# Patient Record
Sex: Female | Born: 1965
Health system: Southern US, Community
[De-identification: ages and names within clinical notes are randomized; demographics above are authoritative.]

## PROBLEM LIST (undated history)

## (undated) DIAGNOSIS — Z789 Other specified health status: Secondary | ICD-10-CM

## (undated) DIAGNOSIS — I517 Cardiomegaly: Secondary | ICD-10-CM

## (undated) DIAGNOSIS — I10 Essential (primary) hypertension: Secondary | ICD-10-CM

## (undated) HISTORY — DX: Cardiomegaly: I51.7

## (undated) HISTORY — DX: Essential (primary) hypertension: I10

---

## 1997-02-20 HISTORY — PX: MYOMECTOMY: SHX85

## 1997-03-23 ENCOUNTER — Encounter: Admission: RE | Admit: 1997-03-23 | Discharge: 1997-06-21 | Payer: Self-pay | Admitting: Obstetrics & Gynecology

## 1997-06-01 ENCOUNTER — Encounter: Admission: RE | Admit: 1997-06-01 | Discharge: 1997-06-01 | Payer: Self-pay | Admitting: Family Medicine

## 1997-07-02 ENCOUNTER — Encounter: Admission: RE | Admit: 1997-07-02 | Discharge: 1997-07-02 | Payer: Self-pay | Admitting: Family Medicine

## 1997-07-07 ENCOUNTER — Encounter: Admission: RE | Admit: 1997-07-07 | Discharge: 1997-07-07 | Payer: Self-pay | Admitting: Obstetrics & Gynecology

## 1997-08-05 ENCOUNTER — Encounter: Admission: RE | Admit: 1997-08-05 | Discharge: 1997-08-05 | Payer: Self-pay | Admitting: Family Medicine

## 1997-09-01 ENCOUNTER — Encounter: Admission: RE | Admit: 1997-09-01 | Discharge: 1997-09-01 | Payer: Self-pay | Admitting: Obstetrics & Gynecology

## 1997-09-10 ENCOUNTER — Ambulatory Visit (HOSPITAL_COMMUNITY): Admission: RE | Admit: 1997-09-10 | Discharge: 1997-09-10 | Payer: Self-pay | Admitting: Obstetrics & Gynecology

## 1997-09-16 ENCOUNTER — Encounter: Admission: RE | Admit: 1997-09-16 | Discharge: 1997-09-16 | Payer: Self-pay | Admitting: Family Medicine

## 1997-09-18 ENCOUNTER — Encounter: Admission: RE | Admit: 1997-09-18 | Discharge: 1997-09-18 | Payer: Self-pay | Admitting: Family Medicine

## 1997-09-21 ENCOUNTER — Encounter: Admission: RE | Admit: 1997-09-21 | Discharge: 1997-09-21 | Payer: Self-pay | Admitting: Family Medicine

## 1997-10-06 ENCOUNTER — Encounter: Admission: RE | Admit: 1997-10-06 | Discharge: 1997-10-06 | Payer: Self-pay | Admitting: Sports Medicine

## 1997-12-10 ENCOUNTER — Inpatient Hospital Stay (HOSPITAL_COMMUNITY): Admission: RE | Admit: 1997-12-10 | Discharge: 1997-12-13 | Payer: Self-pay | Admitting: Obstetrics & Gynecology

## 1997-12-15 ENCOUNTER — Encounter: Admission: RE | Admit: 1997-12-15 | Discharge: 1997-12-15 | Payer: Self-pay | Admitting: Obstetrics & Gynecology

## 1998-01-12 ENCOUNTER — Encounter: Admission: RE | Admit: 1998-01-12 | Discharge: 1998-01-12 | Payer: Self-pay | Admitting: Obstetrics & Gynecology

## 1998-01-22 ENCOUNTER — Encounter: Admission: RE | Admit: 1998-01-22 | Discharge: 1998-01-22 | Payer: Self-pay | Admitting: Internal Medicine

## 1998-06-02 ENCOUNTER — Encounter: Admission: RE | Admit: 1998-06-02 | Discharge: 1998-06-02 | Payer: Self-pay | Admitting: Family Medicine

## 1998-11-23 ENCOUNTER — Emergency Department (HOSPITAL_COMMUNITY): Admission: EM | Admit: 1998-11-23 | Discharge: 1998-11-23 | Payer: Self-pay | Admitting: Emergency Medicine

## 1998-11-24 ENCOUNTER — Encounter: Admission: RE | Admit: 1998-11-24 | Discharge: 1998-11-24 | Payer: Self-pay | Admitting: Hematology and Oncology

## 1998-11-29 ENCOUNTER — Encounter: Admission: RE | Admit: 1998-11-29 | Discharge: 1998-11-29 | Payer: Self-pay | Admitting: Internal Medicine

## 1998-12-08 ENCOUNTER — Encounter: Payer: Self-pay | Admitting: *Deleted

## 1998-12-08 ENCOUNTER — Encounter: Admission: RE | Admit: 1998-12-08 | Discharge: 1998-12-08 | Payer: Self-pay | Admitting: Hematology and Oncology

## 1998-12-08 ENCOUNTER — Ambulatory Visit (HOSPITAL_COMMUNITY): Admission: RE | Admit: 1998-12-08 | Discharge: 1998-12-08 | Payer: Self-pay | Admitting: *Deleted

## 1998-12-14 ENCOUNTER — Encounter: Admission: RE | Admit: 1998-12-14 | Discharge: 1998-12-14 | Payer: Self-pay | Admitting: Internal Medicine

## 1998-12-28 ENCOUNTER — Encounter: Payer: Self-pay | Admitting: Sports Medicine

## 1998-12-28 ENCOUNTER — Encounter: Admission: RE | Admit: 1998-12-28 | Discharge: 1998-12-28 | Payer: Self-pay | Admitting: Sports Medicine

## 1998-12-30 ENCOUNTER — Encounter: Admission: RE | Admit: 1998-12-30 | Discharge: 1998-12-30 | Payer: Self-pay | Admitting: Family Medicine

## 1999-01-03 ENCOUNTER — Inpatient Hospital Stay (HOSPITAL_COMMUNITY): Admission: RE | Admit: 1999-01-03 | Discharge: 1999-01-03 | Payer: Self-pay

## 1999-01-07 ENCOUNTER — Encounter: Admission: RE | Admit: 1999-01-07 | Discharge: 1999-01-07 | Payer: Self-pay | Admitting: Family Medicine

## 1999-01-25 ENCOUNTER — Encounter: Admission: RE | Admit: 1999-01-25 | Discharge: 1999-02-08 | Payer: Self-pay | Admitting: *Deleted

## 1999-01-27 ENCOUNTER — Encounter: Admission: RE | Admit: 1999-01-27 | Discharge: 1999-01-27 | Payer: Self-pay | Admitting: Family Medicine

## 1999-03-02 ENCOUNTER — Encounter: Admission: RE | Admit: 1999-03-02 | Discharge: 1999-03-02 | Payer: Self-pay | Admitting: Family Medicine

## 1999-04-01 ENCOUNTER — Encounter: Admission: RE | Admit: 1999-04-01 | Discharge: 1999-04-01 | Payer: Self-pay | Admitting: Family Medicine

## 1999-04-04 ENCOUNTER — Encounter: Admission: RE | Admit: 1999-04-04 | Discharge: 1999-04-04 | Payer: Self-pay | Admitting: Family Medicine

## 1999-05-05 ENCOUNTER — Encounter: Admission: RE | Admit: 1999-05-05 | Discharge: 1999-05-05 | Payer: Self-pay | Admitting: Sports Medicine

## 1999-06-07 ENCOUNTER — Encounter: Admission: RE | Admit: 1999-06-07 | Discharge: 1999-06-07 | Payer: Self-pay | Admitting: Family Medicine

## 1999-06-23 ENCOUNTER — Encounter: Admission: RE | Admit: 1999-06-23 | Discharge: 1999-06-23 | Payer: Self-pay | Admitting: Family Medicine

## 1999-06-24 ENCOUNTER — Ambulatory Visit (HOSPITAL_COMMUNITY): Admission: RE | Admit: 1999-06-24 | Discharge: 1999-06-24 | Payer: Self-pay | Admitting: Family Medicine

## 1999-07-05 ENCOUNTER — Encounter: Admission: RE | Admit: 1999-07-05 | Discharge: 1999-07-05 | Payer: Self-pay | Admitting: Family Medicine

## 1999-08-05 ENCOUNTER — Encounter: Admission: RE | Admit: 1999-08-05 | Discharge: 1999-08-05 | Payer: Self-pay | Admitting: Sports Medicine

## 1999-08-17 ENCOUNTER — Encounter: Admission: RE | Admit: 1999-08-17 | Discharge: 1999-08-17 | Payer: Self-pay | Admitting: Family Medicine

## 1999-08-21 ENCOUNTER — Inpatient Hospital Stay (HOSPITAL_COMMUNITY): Admission: AD | Admit: 1999-08-21 | Discharge: 1999-08-23 | Payer: Self-pay | Admitting: *Deleted

## 1999-10-03 ENCOUNTER — Encounter: Admission: RE | Admit: 1999-10-03 | Discharge: 1999-10-03 | Payer: Self-pay | Admitting: Family Medicine

## 1999-12-12 ENCOUNTER — Encounter: Admission: RE | Admit: 1999-12-12 | Discharge: 1999-12-12 | Payer: Self-pay | Admitting: Family Medicine

## 2000-04-09 ENCOUNTER — Encounter: Admission: RE | Admit: 2000-04-09 | Discharge: 2000-04-09 | Payer: Self-pay | Admitting: Family Medicine

## 2000-08-31 ENCOUNTER — Encounter: Payer: Self-pay | Admitting: Family Medicine

## 2000-08-31 ENCOUNTER — Ambulatory Visit (HOSPITAL_COMMUNITY): Admission: RE | Admit: 2000-08-31 | Discharge: 2000-08-31 | Payer: Self-pay | Admitting: Family Medicine

## 2000-11-01 ENCOUNTER — Other Ambulatory Visit: Admission: RE | Admit: 2000-11-01 | Discharge: 2000-11-01 | Payer: Self-pay | Admitting: Internal Medicine

## 2004-01-11 ENCOUNTER — Encounter: Admission: RE | Admit: 2004-01-11 | Discharge: 2004-01-11 | Payer: Self-pay | Admitting: Internal Medicine

## 2004-01-25 ENCOUNTER — Ambulatory Visit (HOSPITAL_COMMUNITY): Admission: RE | Admit: 2004-01-25 | Discharge: 2004-01-25 | Payer: Self-pay | Admitting: Obstetrics

## 2007-03-05 ENCOUNTER — Ambulatory Visit (HOSPITAL_COMMUNITY): Admission: RE | Admit: 2007-03-05 | Discharge: 2007-03-05 | Payer: Self-pay | Admitting: Internal Medicine

## 2008-03-18 ENCOUNTER — Ambulatory Visit (HOSPITAL_COMMUNITY): Admission: RE | Admit: 2008-03-18 | Discharge: 2008-03-18 | Payer: Self-pay | Admitting: Internal Medicine

## 2008-10-16 ENCOUNTER — Ambulatory Visit (HOSPITAL_COMMUNITY): Admission: RE | Admit: 2008-10-16 | Discharge: 2008-10-16 | Payer: Self-pay | Admitting: *Deleted

## 2009-11-08 ENCOUNTER — Emergency Department (HOSPITAL_COMMUNITY): Admission: EM | Admit: 2009-11-08 | Discharge: 2009-11-08 | Payer: Self-pay | Admitting: Family Medicine

## 2010-01-27 ENCOUNTER — Ambulatory Visit (HOSPITAL_COMMUNITY)
Admission: RE | Admit: 2010-01-27 | Discharge: 2010-01-27 | Payer: Self-pay | Source: Home / Self Care | Attending: Internal Medicine | Admitting: Internal Medicine

## 2010-02-08 ENCOUNTER — Encounter
Admission: RE | Admit: 2010-02-08 | Discharge: 2010-02-08 | Payer: Self-pay | Source: Home / Self Care | Attending: Internal Medicine | Admitting: Internal Medicine

## 2010-03-13 ENCOUNTER — Encounter: Payer: Self-pay | Admitting: Internal Medicine

## 2010-07-08 NOTE — Op Note (Signed)
[facility]  Patient:    Rebecca Zamora, Rebecca Zamora                       MRN: [id number] Proc. Date: 08/21/99 Adm. Date:  [id number] Attending:  Michaelle Copas CC:         [facility]                           Operative Report  PROCEDURE:                        Delivery Note.  INDICATIONS:                      Patient has been pushing for an hour with inadequate expulsive efforts.  Nonreassuring fetal heart rate tracing noted, with late return were noted.  Fetal vertex +3 station.  Mighty-Vac M cup explained to the patient, her husband and mother.  They complied and decide to proceed.  BRIEF DELIVERY NOTE:             Using four pulls of the Mighty-Vac, delivery with spontaneous rotation of a viable female over a second-degree episiotomy is noted.  Mild to moderate shoulder dystocia is relieved with a McRoberts, McNuber and suprapubic pressure.  The patient tolerated the procedure well. Pediatricians are in attendance.  Full-term living female with DeVilbiss suctioning on the perineum is delivered and handed to the pediatricians. Apgars 2/7.  Cord ph is collected.  Placenta is delivered manually intact; three-vessel cord is noted.  Cervix negative, no lacerations; repaired with 3-0 Monocryl x 1.  ESTIMATED BLOOD LOSS:             500 cc.  DISPOSITION:                      The patient tolerated the procedure well. Fetus to newborn nursery. DD:  08/21/99 TD:  08/22/99 Job: [id number]

## 2010-12-22 ENCOUNTER — Other Ambulatory Visit (HOSPITAL_COMMUNITY): Payer: Self-pay | Admitting: Obstetrics and Gynecology

## 2010-12-22 DIAGNOSIS — Z1231 Encounter for screening mammogram for malignant neoplasm of breast: Secondary | ICD-10-CM

## 2010-12-23 ENCOUNTER — Other Ambulatory Visit: Payer: Self-pay | Admitting: Obstetrics and Gynecology

## 2010-12-23 DIAGNOSIS — Z1231 Encounter for screening mammogram for malignant neoplasm of breast: Secondary | ICD-10-CM

## 2011-01-16 ENCOUNTER — Other Ambulatory Visit: Payer: Self-pay | Admitting: Obstetrics and Gynecology

## 2011-01-16 ENCOUNTER — Other Ambulatory Visit (HOSPITAL_COMMUNITY): Payer: 59

## 2011-01-16 MED ORDER — ACETAMINOPHEN 10 MG/ML IV SOLN: 1000.0000 mg | Freq: Four times a day (QID) | INTRAVENOUS | Status: AC

## 2011-01-18 ENCOUNTER — Encounter (HOSPITAL_COMMUNITY): Payer: Self-pay | Admitting: Pharmacist

## 2011-01-20 ENCOUNTER — Encounter (HOSPITAL_COMMUNITY): Payer: Self-pay

## 2011-01-20 ENCOUNTER — Encounter (HOSPITAL_COMMUNITY)
Admission: RE | Admit: 2011-01-20 | Discharge: 2011-01-20 | Disposition: A | Discharge status: Home / Self Care | Payer: 59 | Source: Ambulatory Visit | Attending: Obstetrics and Gynecology | Admitting: Obstetrics and Gynecology

## 2011-01-20 HISTORY — DX: Other specified health status: Z78.9

## 2011-01-20 LAB — CBC
HCT: 37 % (ref 36.0–46.0)
Hemoglobin: 12.2 g/dL (ref 12.0–15.0)
MCH: 29.5 pg (ref 26.0–34.0)
MCHC: 33 g/dL (ref 30.0–36.0)
MCV: 89.6 fL (ref 78.0–100.0)
Platelets: 235 10*3/uL (ref 150–400)
RBC: 4.13 MIL/uL (ref 3.87–5.11)
RDW: 13.8 % (ref 11.5–15.5)
WBC: 4.9 10*3/uL (ref 4.0–10.5)

## 2011-01-20 LAB — BASIC METABOLIC PANEL
CO2: 26 mEq/L (ref 19–32)
Calcium: 9.4 mg/dL (ref 8.4–10.5)
Creatinine, Ser: 1.02 mg/dL (ref 0.50–1.10)
GFR calc Af Amer: 76 mL/min — ABNORMAL LOW (ref >90)
GFR calc non Af Amer: 66 mL/min — ABNORMAL LOW (ref >90)
Potassium: 3.5 mEq/L (ref 3.5–5.1)
Sodium: 138 mEq/L (ref 135–145)

## 2011-01-20 LAB — SURGICAL PCR SCREEN
MRSA, PCR: NEGATIVE
Staphylococcus aureus: NEGATIVE

## 2011-01-20 NOTE — Patient Instructions (Addendum)
YOUR PROCEDURE IS SCHEDULED ON: Mon 01/23/11  ENTER THROUGH THE MAIN ENTRANCE OF [facility] AT:1130 am ( as instructed by office)  USE DESK PHONE AND DIAL [phone] TO INFORM us OF YOUR ARRIVAL  CALL [phone] IF YOU HAVE ANY QUESTIONS OR PROBLEMS PRIOR TO YOUR ARRIVAL.  REMEMBER: DO NOT EAT AFTER MIDNIGHT :Sunday  SPECIAL INSTRUCTIONS:clear liquids ok until 6am on Monday   YOU MAY BRUSH YOUR TEETH THE MORNING OF SURGERY   TAKE THESE MEDICINES THE DAY OF SURGERY WITH SIP OF WATER:none   DO NOT WEAR JEWELRY, EYE MAKEUP, LIPSTICK OR DARK FINGERNAIL POLISH DO NOT WEAR LOTIONS OR DEODORANT DO NOT SHAVE FOR 48 HOURS PRIOR TO SURGERY  YOU WILL NOT BE ALLOWED TO DRIVE YOURSELF HOME.  NAME OF DRIVER: Darcey Nora

## 2011-01-22 ENCOUNTER — Other Ambulatory Visit (HOSPITAL_COMMUNITY): Payer: Self-pay | Admitting: Obstetrics and Gynecology

## 2011-01-22 MED ORDER — CEFAZOLIN SODIUM-DEXTROSE 2-3 GM-% IV SOLR
2.0000 g | INTRAVENOUS | Status: AC
  Administered 2011-01-23: 2 g via INTRAVENOUS
  Filled 2011-01-22: qty 50

## 2011-01-23 ENCOUNTER — Encounter (HOSPITAL_COMMUNITY): Payer: Self-pay | Admitting: *Deleted

## 2011-01-23 ENCOUNTER — Encounter (HOSPITAL_COMMUNITY): Payer: Self-pay | Admitting: Anesthesiology

## 2011-01-23 ENCOUNTER — Ambulatory Visit (HOSPITAL_COMMUNITY)
Admission: RE | Admit: 2011-01-23 | Discharge: 2011-01-24 | Disposition: A | Discharge status: Home / Self Care | Payer: 59 | Source: Ambulatory Visit | Attending: Obstetrics and Gynecology | Admitting: Obstetrics and Gynecology

## 2011-01-23 ENCOUNTER — Other Ambulatory Visit: Payer: Self-pay | Admitting: Obstetrics and Gynecology

## 2011-01-23 ENCOUNTER — Encounter (HOSPITAL_COMMUNITY)
Admission: RE | Disposition: A | Discharge status: Home / Self Care | Payer: Self-pay | Source: Ambulatory Visit | Attending: Obstetrics and Gynecology

## 2011-01-23 ENCOUNTER — Ambulatory Visit (HOSPITAL_COMMUNITY): Payer: 59 | Admitting: Anesthesiology

## 2011-01-23 DIAGNOSIS — R102 Pelvic and perineal pain: Secondary | ICD-10-CM

## 2011-01-23 DIAGNOSIS — D250 Submucous leiomyoma of uterus: Secondary | ICD-10-CM | POA: Insufficient documentation

## 2011-01-23 DIAGNOSIS — N920 Excessive and frequent menstruation with regular cycle: Secondary | ICD-10-CM | POA: Insufficient documentation

## 2011-01-23 DIAGNOSIS — Z01818 Encounter for other preprocedural examination: Secondary | ICD-10-CM | POA: Insufficient documentation

## 2011-01-23 DIAGNOSIS — Z01812 Encounter for preprocedural laboratory examination: Secondary | ICD-10-CM | POA: Insufficient documentation

## 2011-01-23 DIAGNOSIS — D259 Leiomyoma of uterus, unspecified: Secondary | ICD-10-CM | POA: Diagnosis present

## 2011-01-23 DIAGNOSIS — D251 Intramural leiomyoma of uterus: Secondary | ICD-10-CM | POA: Insufficient documentation

## 2011-01-23 DIAGNOSIS — Z90710 Acquired absence of both cervix and uterus: Secondary | ICD-10-CM

## 2011-01-23 DIAGNOSIS — N949 Unspecified condition associated with female genital organs and menstrual cycle: Secondary | ICD-10-CM | POA: Insufficient documentation

## 2011-01-23 DIAGNOSIS — N946 Dysmenorrhea, unspecified: Secondary | ICD-10-CM | POA: Insufficient documentation

## 2011-01-23 DIAGNOSIS — D252 Subserosal leiomyoma of uterus: Secondary | ICD-10-CM | POA: Insufficient documentation

## 2011-01-23 HISTORY — DX: Pelvic and perineal pain: R10.2

## 2011-01-23 SURGERY — ROBOTIC ASSISTED TOTAL HYSTERECTOMY
Anesthesia: General | Site: Uterus | Wound class: Clean Contaminated

## 2011-01-23 MED ORDER — DEXTROSE IN LACTATED RINGERS 5 % IV SOLN
INTRAVENOUS | Status: DC
  Administered 2011-01-23 – 2011-01-24 (×2): via INTRAVENOUS

## 2011-01-23 MED ORDER — ONDANSETRON HCL 4 MG/2ML IJ SOLN
4.0000 mg | Freq: Four times a day (QID) | INTRAMUSCULAR | Status: DC | PRN
  Administered 2011-01-23 – 2011-01-24 (×2): 4 mg via INTRAVENOUS
  Filled 2011-01-23: qty 2

## 2011-01-23 MED ORDER — FENTANYL CITRATE 0.05 MG/ML IJ SOLN
INTRAMUSCULAR | Status: AC
  Administered 2011-01-23: 50 ug via INTRAVENOUS
  Filled 2011-01-23: qty 2

## 2011-01-23 MED ORDER — LIDOCAINE HCL (CARDIAC) 20 MG/ML IV SOLN
INTRAVENOUS | Status: AC
  Filled 2011-01-23: qty 5

## 2011-01-23 MED ORDER — CEFAZOLIN SODIUM 1-5 GM-% IV SOLN
1.0000 g | Freq: Three times a day (TID) | INTRAVENOUS | Status: AC
  Administered 2011-01-23 – 2011-01-24 (×2): 1 g via INTRAVENOUS
  Filled 2011-01-23 (×2): qty 50

## 2011-01-23 MED ORDER — HYDROMORPHONE HCL PF 1 MG/ML IJ SOLN
INTRAMUSCULAR | Status: AC
  Filled 2011-01-23: qty 1

## 2011-01-23 MED ORDER — GLYCOPYRROLATE 0.2 MG/ML IJ SOLN
INTRAMUSCULAR | Status: DC | PRN
  Administered 2011-01-23 (×2): 0.1 mg via INTRAVENOUS
  Administered 2011-01-23: 1 mg via INTRAVENOUS

## 2011-01-23 MED ORDER — ROCURONIUM BROMIDE 50 MG/5ML IV SOLN
INTRAVENOUS | Status: AC
  Filled 2011-01-23: qty 2

## 2011-01-23 MED ORDER — PANTOPRAZOLE SODIUM 40 MG PO TBEC
40.0000 mg | DELAYED_RELEASE_TABLET | Freq: Every day | ORAL | Status: DC
  Filled 2011-01-23 (×2): qty 1

## 2011-01-23 MED ORDER — DEXAMETHASONE SODIUM PHOSPHATE 10 MG/ML IJ SOLN
INTRAMUSCULAR | Status: AC
  Filled 2011-01-23: qty 1

## 2011-01-23 MED ORDER — FENTANYL CITRATE 0.05 MG/ML IJ SOLN
INTRAMUSCULAR | Status: AC
Start: 2011-01-23 — End: 2011-01-23
  Administered 2011-01-23: 50 ug via INTRAVENOUS
  Filled 2011-01-23: qty 2

## 2011-01-23 MED ORDER — PROPOFOL 10 MG/ML IV EMUL
INTRAVENOUS | Status: AC
  Filled 2011-01-23: qty 20

## 2011-01-23 MED ORDER — PROPOFOL 10 MG/ML IV EMUL
INTRAVENOUS | Status: DC | PRN
  Administered 2011-01-23: 150 mg via INTRAVENOUS

## 2011-01-23 MED ORDER — ONDANSETRON HCL 4 MG/2ML IJ SOLN
INTRAMUSCULAR | Status: AC
  Filled 2011-01-23: qty 2

## 2011-01-23 MED ORDER — IBUPROFEN 800 MG PO TABS: 800.0000 mg | ORAL_TABLET | Freq: Three times a day (TID) | ORAL | Status: DC | PRN

## 2011-01-23 MED ORDER — LACTATED RINGERS IR SOLN
Status: DC | PRN
  Administered 2011-01-23: 3000 mL

## 2011-01-23 MED ORDER — SODIUM CHLORIDE 0.9 % IJ SOLN: 9.0000 mL | INTRAMUSCULAR | Status: DC | PRN

## 2011-01-23 MED ORDER — SODIUM CHLORIDE 0.9 % IJ SOLN
INTRAMUSCULAR | Status: DC | PRN
  Administered 2011-01-23: 10 mL

## 2011-01-23 MED ORDER — ACETAMINOPHEN 10 MG/ML IV SOLN
INTRAVENOUS | Status: DC | PRN
  Administered 2011-01-23: 1000 mg via INTRAVENOUS

## 2011-01-23 MED ORDER — MENTHOL 3 MG MT LOZG: 1.0000 | LOZENGE | OROMUCOSAL | Status: DC | PRN

## 2011-01-23 MED ORDER — DIPHENHYDRAMINE HCL 50 MG/ML IJ SOLN: 12.5000 mg | Freq: Four times a day (QID) | INTRAMUSCULAR | Status: DC | PRN

## 2011-01-23 MED ORDER — NALOXONE HCL 0.4 MG/ML IJ SOLN: 0.4000 mg | INTRAMUSCULAR | Status: DC | PRN

## 2011-01-23 MED ORDER — ONDANSETRON HCL 4 MG PO TABS: 4.0000 mg | ORAL_TABLET | Freq: Four times a day (QID) | ORAL | Status: DC | PRN

## 2011-01-23 MED ORDER — ONDANSETRON HCL 4 MG/2ML IJ SOLN
INTRAMUSCULAR | Status: DC | PRN
  Administered 2011-01-23: 4 mg via INTRAVENOUS

## 2011-01-23 MED ORDER — LACTATED RINGERS IV SOLN
INTRAVENOUS | Status: DC
  Administered 2011-01-23: 18:00:00 via INTRAVENOUS

## 2011-01-23 MED ORDER — KETOROLAC TROMETHAMINE 30 MG/ML IJ SOLN
30.0000 mg | Freq: Four times a day (QID) | INTRAMUSCULAR | Status: DC
  Administered 2011-01-24 (×2): 30 mg via INTRAVENOUS
  Filled 2011-01-23 (×2): qty 1

## 2011-01-23 MED ORDER — FENTANYL CITRATE 0.05 MG/ML IJ SOLN
INTRAMUSCULAR | Status: AC
  Filled 2011-01-23: qty 5

## 2011-01-23 MED ORDER — ROCURONIUM BROMIDE 100 MG/10ML IV SOLN
INTRAVENOUS | Status: DC | PRN
  Administered 2011-01-23: 5 mg via INTRAVENOUS
  Administered 2011-01-23: 10 mg via INTRAVENOUS
  Administered 2011-01-23: 40 mg via INTRAVENOUS
  Administered 2011-01-23: 10 mg via INTRAVENOUS

## 2011-01-23 MED ORDER — FENTANYL CITRATE 0.05 MG/ML IJ SOLN
INTRAMUSCULAR | Status: DC | PRN
  Administered 2011-01-23 (×3): 50 ug via INTRAVENOUS
  Administered 2011-01-23: 100 ug via INTRAVENOUS

## 2011-01-23 MED ORDER — NEOSTIGMINE METHYLSULFATE 1 MG/ML IJ SOLN
INTRAMUSCULAR | Status: DC | PRN
  Administered 2011-01-23: 5 mg via INTRAVENOUS

## 2011-01-23 MED ORDER — ZOLPIDEM TARTRATE 5 MG PO TABS: 5.0000 mg | ORAL_TABLET | Freq: Every evening | ORAL | Status: DC | PRN

## 2011-01-23 MED ORDER — FENTANYL CITRATE 0.05 MG/ML IJ SOLN
25.0000 ug | INTRAMUSCULAR | Status: DC | PRN
  Administered 2011-01-23 (×4): 50 ug via INTRAVENOUS

## 2011-01-23 MED ORDER — HYDROMORPHONE HCL 2 MG PO TABS: 4.0000 mg | ORAL_TABLET | ORAL | Status: DC | PRN

## 2011-01-23 MED ORDER — PROMETHAZINE HCL 25 MG/ML IJ SOLN
12.5000 mg | Freq: Once | INTRAMUSCULAR | Status: AC
  Administered 2011-01-23: 12.5 mg via INTRAVENOUS
  Filled 2011-01-23: qty 1

## 2011-01-23 MED ORDER — DIPHENHYDRAMINE HCL 12.5 MG/5ML PO ELIX: 12.5000 mg | ORAL_SOLUTION | Freq: Four times a day (QID) | ORAL | Status: DC | PRN

## 2011-01-23 MED ORDER — GLYCOPYRROLATE 0.2 MG/ML IJ SOLN
INTRAMUSCULAR | Status: AC
  Filled 2011-01-23: qty 1

## 2011-01-23 MED ORDER — HYDROMORPHONE HCL PF 1 MG/ML IJ SOLN: 0.2000 mg | INTRAMUSCULAR | Status: DC | PRN

## 2011-01-23 MED ORDER — BUPIVACAINE HCL (PF) 0.25 % IJ SOLN
INTRAMUSCULAR | Status: DC | PRN
  Administered 2011-01-23: 15 mL

## 2011-01-23 MED ORDER — GLYCOPYRROLATE 0.2 MG/ML IJ SOLN
INTRAMUSCULAR | Status: AC
  Filled 2011-01-23: qty 3

## 2011-01-23 MED ORDER — HYDROMORPHONE 0.3 MG/ML IV SOLN
INTRAVENOUS | Status: DC
  Administered 2011-01-23: 7.5 mg via INTRAVENOUS
  Administered 2011-01-23: 2.19 mg via INTRAVENOUS
  Administered 2011-01-24: 0.2 mg via INTRAVENOUS
  Filled 2011-01-23: qty 25

## 2011-01-23 MED ORDER — ONDANSETRON HCL 4 MG/2ML IJ SOLN
4.0000 mg | Freq: Four times a day (QID) | INTRAMUSCULAR | Status: DC | PRN
  Filled 2011-01-23: qty 2

## 2011-01-23 MED ORDER — LIDOCAINE HCL (CARDIAC) 20 MG/ML IV SOLN
INTRAVENOUS | Status: DC | PRN
  Administered 2011-01-23: 80 mg via INTRAVENOUS

## 2011-01-23 MED ORDER — ARTIFICIAL TEARS OP OINT
TOPICAL_OINTMENT | OPHTHALMIC | Status: DC | PRN
  Administered 2011-01-23: 1 via OPHTHALMIC

## 2011-01-23 MED ORDER — KETOROLAC TROMETHAMINE 60 MG/2ML IM SOLN
INTRAMUSCULAR | Status: DC | PRN
  Administered 2011-01-23: 30 mg via INTRAMUSCULAR

## 2011-01-23 MED ORDER — HYDROMORPHONE HCL PF 1 MG/ML IJ SOLN
INTRAMUSCULAR | Status: DC | PRN
  Administered 2011-01-23: 0.5 mg via INTRAVENOUS

## 2011-01-23 MED ORDER — MIDAZOLAM HCL 2 MG/2ML IJ SOLN
INTRAMUSCULAR | Status: AC
  Filled 2011-01-23: qty 2

## 2011-01-23 MED ORDER — LACTATED RINGERS IV SOLN
INTRAVENOUS | Status: DC
  Administered 2011-01-23: 125 mL/h via INTRAVENOUS
  Administered 2011-01-23: 16:00:00 via INTRAVENOUS

## 2011-01-23 MED ORDER — ACETAMINOPHEN 10 MG/ML IV SOLN
1000.0000 mg | Freq: Once | INTRAVENOUS | Status: DC
  Filled 2011-01-23: qty 100

## 2011-01-23 MED ORDER — DEXAMETHASONE SODIUM PHOSPHATE 10 MG/ML IJ SOLN
INTRAMUSCULAR | Status: DC | PRN
  Administered 2011-01-23: 10 mg via INTRAVENOUS

## 2011-01-23 MED ORDER — MIDAZOLAM HCL 5 MG/5ML IJ SOLN
INTRAMUSCULAR | Status: DC | PRN
  Administered 2011-01-23 (×2): 1 mg via INTRAVENOUS

## 2011-01-23 MED ORDER — KETOROLAC TROMETHAMINE 30 MG/ML IJ SOLN: 30.0000 mg | Freq: Four times a day (QID) | INTRAMUSCULAR | Status: DC

## 2011-01-23 MED ORDER — KETOROLAC TROMETHAMINE 30 MG/ML IJ SOLN
INTRAMUSCULAR | Status: DC | PRN
  Administered 2011-01-23: 30 mg via INTRAVENOUS

## 2011-01-23 MED ORDER — NEOSTIGMINE METHYLSULFATE 1 MG/ML IJ SOLN
INTRAMUSCULAR | Status: AC
  Filled 2011-01-23: qty 10

## 2011-01-23 SURGICAL SUPPLY — 54 items
ADH SKN CLS APL DERMABOND .7 (GAUZE/BANDAGES/DRESSINGS) ×2
BAG URINE DRAINAGE (UROLOGICAL SUPPLIES) ×3 IMPLANT
BARRIER ADHS 3X4 INTERCEED (GAUZE/BANDAGES/DRESSINGS) ×2 IMPLANT
BRR ADH 4X3 ABS CNTRL BYND (GAUZE/BANDAGES/DRESSINGS)
CABLE HIGH FREQUENCY MONO STRZ (ELECTRODE) ×3 IMPLANT
CATH FOLEY 3WAY  5CC 16FR (CATHETERS) ×1
CATH FOLEY 3WAY 5CC 16FR (CATHETERS) ×2 IMPLANT
CHLORAPREP W/TINT 26ML (MISCELLANEOUS) ×3 IMPLANT
CLOTH BEACON ORANGE TIMEOUT ST (SAFETY) ×3 IMPLANT
CONT PATH 16OZ SNAP LID 3702 (MISCELLANEOUS) ×3 IMPLANT
COVER MAYO STAND STRL (DRAPES) ×3 IMPLANT
COVER TABLE BACK 60X90 (DRAPES) ×6 IMPLANT
COVER TIP SHEARS 8 DVNC (MISCELLANEOUS) ×2 IMPLANT
COVER TIP SHEARS 8MM DA VINCI (MISCELLANEOUS) ×1
DECANTER SPIKE VIAL GLASS SM (MISCELLANEOUS) ×3 IMPLANT
DERMABOND ADVANCED (GAUZE/BANDAGES/DRESSINGS) ×1
DERMABOND ADVANCED .7 DNX12 (GAUZE/BANDAGES/DRESSINGS) ×2 IMPLANT
DRAPE HUG U DISPOSABLE (DRAPE) ×3 IMPLANT
DRAPE LG THREE QUARTER DISP (DRAPES) ×6 IMPLANT
DRAPE MONITOR DA VINCI (DRAPE) ×3 IMPLANT
DRAPE WARM FLUID 44X44 (DRAPE) ×3 IMPLANT
ELECT REM PT RETURN 9FT ADLT (ELECTROSURGICAL) ×3
ELECTRODE REM PT RTRN 9FT ADLT (ELECTROSURGICAL) ×2 IMPLANT
EVACUATOR SMOKE 8.L (FILTER) ×3 IMPLANT
GAUZE VASELINE 3X9 (GAUZE/BANDAGES/DRESSINGS) IMPLANT
GLOVE BIO SURGEON STRL SZ 6.5 (GLOVE) ×10 IMPLANT
GLOVE BIOGEL PI IND STRL 7.0 (GLOVE) ×6 IMPLANT
GLOVE BIOGEL PI INDICATOR 7.0 (GLOVE) ×6
GOWN PREVENTION PLUS LG XLONG (DISPOSABLE) ×12 IMPLANT
KIT DISP ACCESSORY 4 ARM (KITS) ×3 IMPLANT
NDL INSUFFLATION 14GA 120MM (NEEDLE) ×1 IMPLANT
NEEDLE INSUFFLATION 14GA 120MM (NEEDLE) ×3 IMPLANT
NS IRRIG 1000ML POUR BTL (IV SOLUTION) ×9 IMPLANT
OCCLUDER COLPOPNEUMO (BALLOONS) ×2 IMPLANT
PACK LAVH (CUSTOM PROCEDURE TRAY) ×3 IMPLANT
PAD PREP 24X48 CUFFED NSTRL (MISCELLANEOUS) ×6 IMPLANT
PLUG CATH AND CAP STER (CATHETERS) ×3 IMPLANT
SCISSORS LAP 5X35 DISP (ENDOMECHANICALS) ×1 IMPLANT
SET IRRIG TUBING LAPAROSCOPIC (IRRIGATION / IRRIGATOR) ×3 IMPLANT
SOLUTION ELECTROLUBE (MISCELLANEOUS) ×3 IMPLANT
SUT VIC AB 0 CT1 27 (SUTURE)
SUT VIC AB 0 CT1 27XBRD ANTBC (SUTURE) ×10 IMPLANT
SUT VICRYL 0 UR6 27IN ABS (SUTURE) ×4 IMPLANT
SUT VICRYL 4-0 PS2 18IN ABS (SUTURE) ×6 IMPLANT
SYR 50ML LL SCALE MARK (SYRINGE) ×3 IMPLANT
TIP UTERINE 6.7X8CM BLUE DISP (MISCELLANEOUS) ×1 IMPLANT
TOWEL OR 17X24 6PK STRL BLUE (TOWEL DISPOSABLE) ×9 IMPLANT
TROCAR 12M 150ML BLUNT (TROCAR) IMPLANT
TROCAR DISP BLADELESS 8 DVNC (TROCAR) ×1 IMPLANT
TROCAR DISP BLADELESS 8MM (TROCAR) ×1
TROCAR XCEL NON-BLD 5MMX100MML (ENDOMECHANICALS) ×1 IMPLANT
TROCAR Z-THREAD 12X150 (TROCAR) ×3 IMPLANT
TUBING FILTER THERMOFLATOR (ELECTROSURGICAL) ×3 IMPLANT
WARMER LAPAROSCOPE (MISCELLANEOUS) ×3 IMPLANT

## 2011-01-23 NOTE — Anesthesia Preprocedure Evaluation (Signed)
Anesthesia Evaluation  Patient identified by MRN, date of birth, ID band Patient awake    Reviewed: Allergy & Precautions, H&P , NPO status , Patient's Chart, lab work & pertinent test results  Airway Mallampati: I TM Distance: >3 FB Neck ROM: full    Dental No notable dental hx.    Pulmonary neg pulmonary ROS,    Pulmonary exam normal       Cardiovascular neg cardio ROS     Neuro/Psych  Headaches, Negative Psych ROS   GI/Hepatic negative GI ROS, Neg liver ROS,   Endo/Other  Negative Endocrine ROS  Renal/GU negative Renal ROS  Genitourinary negative   Musculoskeletal negative musculoskeletal ROS (+)   Abdominal Normal abdominal exam  (+)   Peds negative pediatric ROS (+)  Hematology negative hematology ROS (+)   Anesthesia Other Findings   Reproductive/Obstetrics negative OB ROS                           Anesthesia Physical Anesthesia Plan  ASA: I  Anesthesia Plan: General   Post-op Pain Management:    Induction: Intravenous  Airway Management Planned: Oral ETT  Additional Equipment:   Intra-op Plan:   Post-operative Plan: Extubation in OR  Informed Consent: I have reviewed the patients History and Physical, chart, labs and discussed the procedure including the risks, benefits and alternatives for the proposed anesthesia with the patient or authorized representative who has indicated his/her understanding and acceptance.   Dental advisory given  Plan Discussed with: CRNA  Anesthesia Plan Comments:         Anesthesia Quick Evaluation

## 2011-01-23 NOTE — Anesthesia Postprocedure Evaluation (Signed)
Anesthesia Post Note  Patient: Rebecca Zamora  Procedure(s) Performed:  ROBOTIC ASSISTED TOTAL HYSTERECTOMY; LYSIS OF ADHESION  Anesthesia type: General  Patient location: PACU  Post pain: Pain level controlled  Post assessment: Post-op Vital signs reviewed  Last Vitals:  Filed Vitals:   01/23/11 1730  BP: 149/76  Pulse: 47  Temp:   Resp: 25    Post vital signs: Reviewed  Level of consciousness: sedated  Complications: No apparent anesthesia complicationsfj

## 2011-01-23 NOTE — Anesthesia Procedure Notes (Signed)
Procedure Name: Intubation Date/Time: 01/23/2011 1:10 PM Performed by: Karleen Dolphin Pre-anesthesia Checklist: Suction available, Timeout performed, Emergency Drugs available, Patient identified and Patient being monitored Patient Re-evaluated:Patient Re-evaluated prior to inductionOxygen Delivery Method: Circle System Utilized Preoxygenation: Pre-oxygenation with 100% oxygen Intubation Type: IV induction Ventilation: Mask ventilation without difficulty Laryngoscope Size: Mac and 3 Grade View: Grade I Tube type: Oral Tube size: 7.0 mm Number of attempts: 1 Airway Equipment and Method: stylet Placement Confirmation: ETT inserted through vocal cords under direct vision,  breath sounds checked- equal and bilateral and positive ETCO2 Secured at: 22 cm Tube secured with: Tape Dental Injury: Teeth and Oropharynx as per pre-operative assessment

## 2011-01-23 NOTE — H&P (Signed)
See scanned document.

## 2011-01-23 NOTE — Transfer of Care (Signed)
Immediate Anesthesia Transfer of Care Note  Patient: Rebecca Zamora  Procedure(s) Performed:  ROBOTIC ASSISTED TOTAL HYSTERECTOMY; LYSIS OF ADHESION  Patient Location: PACU  Anesthesia Type: General  Level of Consciousness: awake, alert  and oriented  Airway & Oxygen Therapy: Patient Spontanous Breathing and Patient connected to nasal cannula oxygen  Post-op Assessment: Report given to PACU RN and Post -op Vital signs reviewed and stable  Post vital signs: Reviewed and stable  Complications: No apparent anesthesia complications

## 2011-01-23 NOTE — Progress Notes (Signed)
01/23/2011  5:09 PM  PATIENT: Rebecca Zamora PRE-OPERATIVE DIAGNOSIS:  Fibroids, Menorrhagia, Dysmenorrhea/pelvic pain  POST-OPERATIVE DIAGNOSIS:  Fibroids, Menorrhagia, Dysmenorrhea/Pelvic pain, abdominopelvic adhesions  PROCEDURE: Da Vinci robotic total hysterectomy, lysis of adhesion  SURGEON:  Surgeon(s): Serita Kyle, MD Tresa Endo A. Fogleman  ASSISTANT: Noland Fordyce, MD                       Arlan Organ, CNM  ANESTHESIA:   general  FINDINGS:omental adhesion in the midline of ant abdominal wall, fibroid uterus, ovaries adherent to uterus, post adhesion of bowels to uterus, nl tubes w/ peritubal adhesions, nl liver edge. Ureters peristalsing bilaterally    ESTIMATED BLOOD LOSS: 50 cc   Intake/Output Summary (Last 24 hours) at 01/23/11 1709 Last data filed at 01/23/11 1643  Gross per 24 hour  Intake   1800 ml  Output    300 ml  Net   1500 ml     BLOOD ADMINISTERED:none   SPECIMEN: uterus with cervix,    DISPOSITION OF SPECIMEN:  PATHOLOGY  COUNTS:  YES  PLAN OF CARE: admit for overnight observation

## 2011-01-24 LAB — CBC
HCT: 35.8 % — ABNORMAL LOW (ref 36.0–46.0)
Hemoglobin: 12.3 g/dL (ref 12.0–15.0)
MCHC: 34.4 g/dL (ref 30.0–36.0)
WBC: 9.4 10*3/uL (ref 4.0–10.5)

## 2011-01-24 LAB — BASIC METABOLIC PANEL
BUN: 7 mg/dL (ref 6–23)
CO2: 26 mEq/L (ref 19–32)
Chloride: 101 mEq/L (ref 96–112)
Glucose, Bld: 158 mg/dL — ABNORMAL HIGH (ref 70–99)
Potassium: 3.4 mEq/L — ABNORMAL LOW (ref 3.5–5.1)

## 2011-01-24 MED ORDER — HYDROMORPHONE HCL 4 MG PO TABS: 4.0000 mg | ORAL_TABLET | ORAL | Status: AC | PRN

## 2011-01-24 MED ORDER — IBUPROFEN 800 MG PO TABS: 800.0000 mg | ORAL_TABLET | Freq: Three times a day (TID) | ORAL | Status: AC | PRN

## 2011-01-24 NOTE — Discharge Summary (Signed)
Physician Discharge Summary  Patient ID: Rebecca Zamora MRN: [id number] DOB/AGE: June 01, 1965 45 y.o.  Admit date: 01/23/2011 Discharge date: 01/24/2011  Admission Diagnoses: menorrhagia, pelvic pain, fibroid uterus  Discharge Diagnoses: menorrhagia, pelvic pain, fibroid uterus, abdominopelvic adhesions Principal Problem:  *Pelvic pain in female Active Problems:  Fibroid uterus  Menorrhagia   Discharged Condition: stable Hospital Course:pt underwent da Vinci Total hysterectomy, LOA. Uncomplicated postop course Consults:none  Significant Diagnostic Studies: labs: hgb9.4, hct 35.8 wbc 9.4  plt 238 creat 0.91  Treatments: surgery: Da Vinci Total hysterectomy, LOA  Discharge Exam: Blood pressure 119/77, pulse 51, temperature 98.7 F (37.1 C), temperature source Oral, resp. rate 18, height 5\' 2"  (1.575 m), weight 71.668 kg (158 lb), last menstrual period 01/21/2011, SpO2 100.00%. General appearance: alert, cooperative and no distress Resp: clear to auscultation bilaterally GI: soft, non-tender; bowel sounds normal; no masses,  no organomegaly Pelvic: deferred Extremities: no edema, redness or tenderness in the calves or thighs Incision/Wound:well approximated, clean/dry intact  Disposition:   Discharge Orders    Future Appointments: Provider: Department: Dept Phone: Center:   02/02/2011 3:40 PM [facility] [phone] [facility]     Future Orders Please Complete By Expires   Diet - low sodium heart healthy      Discharge instructions      Comments:   Call if temperature greater than equal to 100.4, nothing per vagina for 4-6 weeks or severe nausea vomiting, increased incisional pain , drainage or redness in the incision site, no straining with bowel movements, showers no bathtub, no straining with bowel movement, no heavy lifting x 2wk ( 25lb), no driving x 2 wk   May walk up steps      No wound care        Current Discharge Medication List    START  taking these medications   Details  HYDROmorphone (DILAUDID) 4 MG tablet Take 1 tablet (4 mg total) by mouth every 4 (four) hours as needed. Qty: 30 tablet, Refills: 0    ibuprofen (ADVIL,MOTRIN) 800 MG tablet Take 1 tablet (800 mg total) by mouth every 8 (eight) hours as needed for pain (mild pain). Qty: 30 tablet, Refills: 4      CONTINUE these medications which have NOT CHANGED   Details  Cholecalciferol (VITAMIN D) 2000 UNITS tablet Take 4,000 Units by mouth daily. Pt has stopped taking prior to procedure.     fish oil-omega-3 fatty acids 1000 MG capsule Take 2 g by mouth daily. Pt has stopped taking prior to procedure.     senna-docusate (SENOKOT-S) 8.6-50 MG per tablet Take 1 tablet by mouth daily. Uses Senokot.     traMADol (ULTRAM) 50 MG tablet Take 50 mg by mouth every 6 (six) hours as needed. Maximum dose= 8 tablets per day  Pt. Stopped taking because of nausea.        Follow-up Information    Follow up with Rebecca Court A, MD in 2 weeks.   Contact information:   [address] [place] [address] [phone]          Signed: Serita Zamora 01/24/2011, 8:35 AM

## 2011-01-24 NOTE — Progress Notes (Signed)
Subjective: Patient reports tolerating PO, + flatus and no problems voiding.    Objective: I have reviewed patient's vital signs.  vital signs, intake and output and labs. Filed Vitals:   01/24/11 0602  BP:   Pulse:   Temp:   Resp: 18   I/O last 3 completed shifts: In: 2000 [I.V.:2000] Out: 2975 [Urine:2850; Emesis/NG output:75; Blood:50]    Lab Results  Component Value Date   WBC 9.4 01/24/2011   HGB 12.3 01/24/2011   HCT 35.8* 01/24/2011   MCV 87.1 01/24/2011   PLT 238 01/24/2011   Lab Results  Component Value Date   CREATININE 0.91 01/24/2011    EXAM General: alert, cooperative and no distress Resp: clear to auscultation bilaterally Cardio: regular rate and rhythm, S1, S2 normal, no murmur, click, rub or gallop GI: soft, non-tender; bowel sounds normal; no masses,  no organomegaly and incision: clean, dry and intact Extremities: no edema, redness or tenderness in the calves or thighs Vaginal Bleeding: none  Assessment: s/p Procedure(s): ROBOTIC ASSISTED TOTAL HYSTERECTOMY LYSIS OF ADHESION: stable  Plan: Advance diet Discontinue IV fluids Discharge home  LOS: 1 day  d/c instructions reviewed. Script: motrin, dilaudid   Audree Schrecengost A, MD 01/24/2011 8:42 AM    01/24/2011, 8:42 AM

## 2011-01-24 NOTE — Anesthesia Postprocedure Evaluation (Signed)
  Anesthesia Post-op Note  Patient: Rebecca Zamora  Procedure(s) Performed:  ROBOTIC ASSISTED TOTAL HYSTERECTOMY; LYSIS OF ADHESION  Patient Location: PACU  Anesthesia Type: General  Level of Consciousness: awake  Airway and Oxygen Therapy: Patient Spontanous Breathing  Post-op Pain: none  Post-op Assessment: Post-op Vital signs reviewed  Post-op Vital Signs: Reviewed and stable  Complications: No apparent anesthesia complications

## 2011-01-24 NOTE — Op Note (Signed)
Rebecca Zamora, Rebecca Zamora                ACCOUNT NO.:  [id number]  MEDICAL RECORD NO.:  [id number]  LOCATION:  9303                          FACILITY:  [facility]  PHYSICIAN:  Maxie Better, M.D.DATE OF BIRTH:  11-03-1965  DATE OF PROCEDURE:  01/23/2011 DATE OF DISCHARGE:                              OPERATIVE REPORT   PREOPERATIVE DIAGNOSES: 1. Pelvic pain. 2. Menorrhagia. 3. Uterine fibroids.  PROCEDURES: 1. Da Vinci total hysterectomy. 2. Lysis of adhesions.  POSTOPERATIVE DIAGNOSES: 1. Pelvic pain. 2. Abdominal wall adhesions. 3. Menorrhagia. 4. Uterine fibroids.  ANESTHESIA:  General.  SURGEON:  Maxie Better, MD.  PROCEDURE IN DETAIL:  Under adequate general anesthesia, the patient was placed in the dorsal lithotomy position.  Examination under anesthesia revealed the anteverted about 10-week-size uterus.  No adnexal masses could be appreciated.  The patient was positioned for robotic surgery. She was prepped with ChloraPrep and Betadine solution.  The patient was then sterilely draped.  A weighted speculum was placed in the vagina. Sims retractor was placed anteriorly.  The cervix had a 0 Vicryl figure- of-eight suture placed on the anterior and posterior lip of the cervix.  The cervical os was then serially dilated up to #25 [facility] dilator, and the uterus sounded to 8 cm.  A #8 uterine manipulator was then obtained. Measurement of the cervix was done.  Small RUMI KOH ring was selected and RUMI manipulator placed without incident, and the weighted speculum was then removed.  A sterile indwelling Foley catheter was placed.  Attention was then turned to the abdomen where a supraumbilical incision was made and Veress needle was introduced.  Opening pressure of 4 was noted.  3 L of CO2 was insufflated.  Veress needle was then removed.  A 12-mm trocar with sleeve was introduced in the abdomen without incident. The robotic camera was then inserted through that port.  On  entering the cavity, it was noted that there was omental adhesions along the entire midline of the abdomen.  The additional port sites was placed with two 8 mm on the left, one 8 mm port site on the right and assistant port of 5. The robot was then docked on the patient's left side, and a Pro grasper, a PK, and monopolar scissors were then placed.  I then transferred to the surgical console, where the first event was the lysed adhesions of the omentum off the anterior abdominal wall.  The uterus was then lifted anteriorly and there were several dense adhesions to the posterior aspect of the uterus, which was also lysed.  The patient's history is notable for previous myomectomy.  The tubes has had some peritubal adhesions, shortened.  Ovaries were adherent to the fundus laterally on both sides.  Posterior cul-de-sac was otherwise unremarkable.  The procedure was began by taking the right ovary off the uterus, cauterizing the left round ligament.  The left utero-ovarian ligament was also serially clamped, cauterized, and cut.  The anterior leaf of the broad ligament was opened transversely and the bladder was sharply dissected off the KOH ring and displaced inferiorly.  The left uterine vessels was subsequently skeletonized.  Incidentally, both ureters had been noted prior to the onset  of the case, and they were both peristalsing.  The uterine vessels were skeletonized on the left.  They were sequentially clamped, cauterized, but not cut and then on the contralateral side, the same procedure with respect to the ovary being taken off the uterine wall was then accomplished.  The anterior leaf of the broad ligament was opened again posteriorly and anteriorly further connecting the vesical peritoneum.  The uterine vessels were skeletonized on the right.  They were clamped on several serial sequential clamps, cauterized, and ultimately cut and on the opposite side, cutting of the uterine  vessels were then accomplished with blanching of the uterus.  The KOH ring was then displaced further.  The insufflator vaginally was then insufflated and the anterior incision was then made overlying this cervical vaginal junction.  The uterus was then severed from its vaginal attachment.  The uterus with cervix was then brought through the vagina.  There were bleeding on the posterior vaginal cuff, which was cauterized with the PK grasper.  Once this was done, it was then noted that the angle on the vaginal side had the tear extending superficial and inferiorly and therefore a running stitch of 2-0 Vicryl suture was placed. The vaginal cuff was then closed with 0 Vicryl figure-of-eight sutures.  The needles would be removed at the end of the case.  Abdomen was irrigated and suctioned.  Both pedicles were noted to be good, and hemostasis.  At that point, the procedure was felt to be completed.  I moved away from the console.  The robot was undocked.  Then, using a 5-mm scope and the supraumbilical site, the 6 needles were then removed.  Once this was done, the abdomen again was irrigated, suctioned, good hemostasis noted.  The vagina was palpated manually for any defects and none was felt.  The robotic camera was again inserted in place.  After good inspection, the lower sites were all removed.  The supraumbilical site was then removed under direct visualization.  The supraumbilical incision was closed with 0 Vicryl figure-of-eight suture on that fascia and all the rest were closed with 4-0 Vicryl subcuticular stitch and Dermabond.  SPECIMEN:  Uterus with cervix weighing 249 g in the operating room.  INTRAOPERATIVE FLUID:  1800 mL.  URINE OUTPUT:  150 mL clear yellow urine.  ESTIMATED BLOOD LOSS:  50 mL.  COMPLICATIONS:  None.  The patient tolerated this procedure well, was transferred to recovery in stable condition.     Maxie Better, M.D.     Tucker/MEDQ  D:  01/23/2011   T:  01/24/2011  Job:  [id number]

## 2011-01-24 NOTE — Addendum Note (Signed)
Addendum  created 01/24/11 1000 by Isabella Bowens   Modules edited:Notes Section

## 2011-01-30 ENCOUNTER — Ambulatory Visit (HOSPITAL_COMMUNITY): Payer: Self-pay

## 2011-02-01 ENCOUNTER — Ambulatory Visit (HOSPITAL_COMMUNITY): Payer: Self-pay

## 2011-02-02 ENCOUNTER — Ambulatory Visit
Admission: RE | Admit: 2011-02-02 | Discharge: 2011-02-02 | Disposition: A | Discharge status: Home / Self Care | Payer: 59 | Source: Ambulatory Visit | Attending: Obstetrics and Gynecology | Admitting: Obstetrics and Gynecology

## 2011-02-02 DIAGNOSIS — Z1231 Encounter for screening mammogram for malignant neoplasm of breast: Secondary | ICD-10-CM

## 2012-01-05 ENCOUNTER — Other Ambulatory Visit: Payer: Self-pay | Admitting: Obstetrics and Gynecology

## 2012-01-05 DIAGNOSIS — Z1231 Encounter for screening mammogram for malignant neoplasm of breast: Secondary | ICD-10-CM

## 2012-02-06 ENCOUNTER — Ambulatory Visit
Admission: RE | Admit: 2012-02-06 | Discharge: 2012-02-06 | Disposition: A | Discharge status: Home / Self Care | Payer: 59 | Source: Ambulatory Visit | Attending: Obstetrics and Gynecology | Admitting: Obstetrics and Gynecology

## 2012-02-06 DIAGNOSIS — Z1231 Encounter for screening mammogram for malignant neoplasm of breast: Secondary | ICD-10-CM

## 2012-02-08 ENCOUNTER — Ambulatory Visit: Payer: 59

## 2012-04-06 ENCOUNTER — Other Ambulatory Visit: Payer: Self-pay

## 2012-04-17 ENCOUNTER — Other Ambulatory Visit (HOSPITAL_COMMUNITY): Payer: Self-pay | Admitting: Internal Medicine

## 2012-04-17 DIAGNOSIS — Z1231 Encounter for screening mammogram for malignant neoplasm of breast: Secondary | ICD-10-CM

## 2012-12-26 ENCOUNTER — Other Ambulatory Visit: Payer: Self-pay

## 2013-01-09 ENCOUNTER — Other Ambulatory Visit: Payer: Self-pay

## 2013-01-09 DIAGNOSIS — Z1231 Encounter for screening mammogram for malignant neoplasm of breast: Secondary | ICD-10-CM

## 2013-02-05 ENCOUNTER — Ambulatory Visit
Admission: RE | Admit: 2013-02-05 | Discharge: 2013-02-05 | Disposition: A | Discharge status: Home / Self Care | Payer: 59 | Source: Ambulatory Visit

## 2013-02-05 DIAGNOSIS — Z1231 Encounter for screening mammogram for malignant neoplasm of breast: Secondary | ICD-10-CM

## 2013-02-06 ENCOUNTER — Ambulatory Visit: Payer: 59

## 2013-02-12 ENCOUNTER — Other Ambulatory Visit: Payer: Self-pay | Admitting: Internal Medicine

## 2013-02-12 DIAGNOSIS — R928 Other abnormal and inconclusive findings on diagnostic imaging of breast: Secondary | ICD-10-CM

## 2013-02-28 ENCOUNTER — Ambulatory Visit
Admission: RE | Admit: 2013-02-28 | Discharge: 2013-02-28 | Disposition: A | Discharge status: Home / Self Care | Payer: 59 | Source: Ambulatory Visit | Attending: Internal Medicine | Admitting: Internal Medicine

## 2013-02-28 ENCOUNTER — Ambulatory Visit
Admission: RE | Admit: 2013-02-28 | Discharge: 2013-02-28 | Disposition: A | Discharge status: Home / Self Care | Payer: Self-pay | Source: Ambulatory Visit | Attending: Internal Medicine | Admitting: Internal Medicine

## 2013-02-28 DIAGNOSIS — R928 Other abnormal and inconclusive findings on diagnostic imaging of breast: Secondary | ICD-10-CM

## 2013-06-20 ENCOUNTER — Other Ambulatory Visit: Payer: Self-pay | Admitting: Physician Assistant

## 2013-12-03 ENCOUNTER — Other Ambulatory Visit: Payer: Self-pay

## 2013-12-03 DIAGNOSIS — Z1239 Encounter for other screening for malignant neoplasm of breast: Secondary | ICD-10-CM

## 2014-02-04 ENCOUNTER — Other Ambulatory Visit: Payer: Self-pay

## 2014-02-04 ENCOUNTER — Ambulatory Visit
Admission: RE | Admit: 2014-02-04 | Discharge: 2014-02-04 | Disposition: A | Discharge status: Home / Self Care | Payer: 59 | Source: Ambulatory Visit

## 2014-02-04 DIAGNOSIS — Z1231 Encounter for screening mammogram for malignant neoplasm of breast: Secondary | ICD-10-CM

## 2014-02-06 ENCOUNTER — Ambulatory Visit: Payer: 59

## 2014-04-17 ENCOUNTER — Other Ambulatory Visit (HOSPITAL_COMMUNITY): Payer: Self-pay | Admitting: Cardiology

## 2014-04-17 DIAGNOSIS — D689 Coagulation defect, unspecified: Secondary | ICD-10-CM

## 2014-04-21 ENCOUNTER — Ambulatory Visit (HOSPITAL_COMMUNITY): Payer: 59

## 2014-04-23 ENCOUNTER — Ambulatory Visit (HOSPITAL_COMMUNITY)
Admission: RE | Admit: 2014-04-23 | Discharge: 2014-04-23 | Disposition: A | Discharge status: Home / Self Care | Payer: 59 | Source: Ambulatory Visit | Attending: Cardiology | Admitting: Cardiology

## 2014-04-23 DIAGNOSIS — D689 Coagulation defect, unspecified: Secondary | ICD-10-CM | POA: Diagnosis present

## 2014-04-23 DIAGNOSIS — I517 Cardiomegaly: Secondary | ICD-10-CM | POA: Diagnosis not present

## 2014-04-23 MED ORDER — TECHNETIUM TC 99M DIETHYLENETRIAME-PENTAACETIC ACID: 40.0000 | Freq: Once | INTRAVENOUS | Status: AC | PRN

## 2014-04-23 MED ORDER — TECHNETIUM TO 99M ALBUMIN AGGREGATED
6.0000 | Freq: Once | INTRAVENOUS | Status: AC | PRN
  Administered 2014-04-23: 6 via INTRAVENOUS

## 2015-01-01 ENCOUNTER — Other Ambulatory Visit: Payer: Self-pay

## 2015-01-01 DIAGNOSIS — Z1231 Encounter for screening mammogram for malignant neoplasm of breast: Secondary | ICD-10-CM

## 2015-02-10 ENCOUNTER — Ambulatory Visit
Admission: RE | Admit: 2015-02-10 | Discharge: 2015-02-10 | Disposition: A | Discharge status: Home / Self Care | Payer: 59 | Source: Ambulatory Visit

## 2015-02-10 DIAGNOSIS — Z1231 Encounter for screening mammogram for malignant neoplasm of breast: Secondary | ICD-10-CM

## 2015-02-24 DIAGNOSIS — M545 Low back pain: Secondary | ICD-10-CM | POA: Diagnosis not present

## 2015-02-24 DIAGNOSIS — F411 Generalized anxiety disorder: Secondary | ICD-10-CM | POA: Diagnosis not present

## 2015-02-24 MED FILL — METHOCARBAMOL 500 MG TABLET: 500 | 30 days supply | Qty: 60 | Fill #0

## 2015-02-24 MED FILL — MELOXICAM 7.5 MG TABLET: 7.5 | 30 days supply | Qty: 30 | Fill #0

## 2015-02-24 MED FILL — LORazepam 0.5 MG TABS: 0.5 | 15 days supply | Qty: 30 | Fill #0

## 2015-04-28 DIAGNOSIS — Z01419 Encounter for gynecological examination (general) (routine) without abnormal findings: Secondary | ICD-10-CM | POA: Diagnosis not present

## 2015-04-29 DIAGNOSIS — M545 Low back pain: Secondary | ICD-10-CM | POA: Diagnosis not present

## 2015-04-29 DIAGNOSIS — Z0000 Encounter for general adult medical examination without abnormal findings: Secondary | ICD-10-CM | POA: Diagnosis not present

## 2015-04-29 DIAGNOSIS — E782 Mixed hyperlipidemia: Secondary | ICD-10-CM | POA: Diagnosis not present

## 2015-04-29 DIAGNOSIS — Z23 Encounter for immunization: Secondary | ICD-10-CM | POA: Diagnosis not present

## 2015-04-29 MED FILL — METHOCARBAMOL 500 MG TABLET: 500 | 30 days supply | Qty: 60 | Fill #0

## 2016-01-03 ENCOUNTER — Other Ambulatory Visit: Payer: Self-pay | Admitting: Obstetrics and Gynecology

## 2016-01-03 DIAGNOSIS — Z1231 Encounter for screening mammogram for malignant neoplasm of breast: Secondary | ICD-10-CM

## 2016-02-11 ENCOUNTER — Ambulatory Visit
Admission: RE | Admit: 2016-02-11 | Discharge: 2016-02-11 | Disposition: A | Discharge status: Home / Self Care | Payer: 59 | Source: Ambulatory Visit | Attending: Obstetrics and Gynecology | Admitting: Obstetrics and Gynecology

## 2016-02-11 DIAGNOSIS — Z1231 Encounter for screening mammogram for malignant neoplasm of breast: Secondary | ICD-10-CM

## 2016-05-03 DIAGNOSIS — E782 Mixed hyperlipidemia: Secondary | ICD-10-CM | POA: Diagnosis not present

## 2016-05-03 DIAGNOSIS — Z0000 Encounter for general adult medical examination without abnormal findings: Secondary | ICD-10-CM | POA: Diagnosis not present

## 2016-05-03 DIAGNOSIS — Z1231 Encounter for screening mammogram for malignant neoplasm of breast: Secondary | ICD-10-CM | POA: Diagnosis not present

## 2016-05-03 DIAGNOSIS — R232 Flushing: Secondary | ICD-10-CM | POA: Diagnosis not present

## 2016-05-03 DIAGNOSIS — Z01419 Encounter for gynecological examination (general) (routine) without abnormal findings: Secondary | ICD-10-CM | POA: Diagnosis not present

## 2016-05-03 DIAGNOSIS — Z6827 Body mass index (BMI) 27.0-27.9, adult: Secondary | ICD-10-CM | POA: Diagnosis not present

## 2017-01-01 DIAGNOSIS — J069 Acute upper respiratory infection, unspecified: Secondary | ICD-10-CM | POA: Diagnosis not present

## 2017-01-01 MED FILL — AZITHROMYCIN 250 MG TABLET: 250 | 5 days supply | Qty: 6 | Fill #0

## 2017-01-01 MED FILL — CHERATUSSIN AC SYRUP: 100-10 | 3 days supply | Qty: 180 | Fill #0

## 2017-01-01 MED FILL — predniSONE 10 MG TABS: 10 | 6 days supply | Qty: 21 | Fill #0

## 2017-01-08 MED FILL — NOREL AD TABLET: 4-10-325 | 5 days supply | Qty: 20 | Fill #0

## 2017-01-10 ENCOUNTER — Other Ambulatory Visit: Payer: Self-pay | Admitting: Obstetrics and Gynecology

## 2017-01-10 DIAGNOSIS — Z1231 Encounter for screening mammogram for malignant neoplasm of breast: Secondary | ICD-10-CM

## 2017-02-14 ENCOUNTER — Ambulatory Visit
Admission: RE | Admit: 2017-02-14 | Discharge: 2017-02-14 | Disposition: A | Discharge status: Home / Self Care | Payer: 59 | Source: Ambulatory Visit | Attending: Obstetrics and Gynecology | Admitting: Obstetrics and Gynecology

## 2017-02-14 ENCOUNTER — Ambulatory Visit: Payer: 59

## 2017-02-14 DIAGNOSIS — Z1231 Encounter for screening mammogram for malignant neoplasm of breast: Secondary | ICD-10-CM | POA: Diagnosis not present

## 2017-03-14 DIAGNOSIS — H524 Presbyopia: Secondary | ICD-10-CM | POA: Diagnosis not present

## 2017-03-14 DIAGNOSIS — H52223 Regular astigmatism, bilateral: Secondary | ICD-10-CM | POA: Diagnosis not present

## 2017-05-09 DIAGNOSIS — M545 Low back pain: Secondary | ICD-10-CM | POA: Diagnosis not present

## 2017-05-09 DIAGNOSIS — Z6827 Body mass index (BMI) 27.0-27.9, adult: Secondary | ICD-10-CM | POA: Diagnosis not present

## 2017-05-09 DIAGNOSIS — Z0000 Encounter for general adult medical examination without abnormal findings: Secondary | ICD-10-CM | POA: Diagnosis not present

## 2017-05-09 DIAGNOSIS — M2550 Pain in unspecified joint: Secondary | ICD-10-CM | POA: Diagnosis not present

## 2017-05-09 DIAGNOSIS — F419 Anxiety disorder, unspecified: Secondary | ICD-10-CM | POA: Diagnosis not present

## 2017-05-09 DIAGNOSIS — Z01419 Encounter for gynecological examination (general) (routine) without abnormal findings: Secondary | ICD-10-CM | POA: Diagnosis not present

## 2017-05-09 MED FILL — MELOXICAM 7.5 MG TABLET: 7.5 | 30 days supply | Qty: 30 | Fill #0

## 2017-05-09 MED FILL — LORazepam 0.5 MG TABS: 0.5 | 30 days supply | Qty: 30 | Fill #0

## 2017-05-16 MED FILL — VIT D2 1.25 MG (50,000 UNIT: 1.25 MG | 28 days supply | Qty: 8 | Fill #0

## 2017-06-07 DIAGNOSIS — Z1211 Encounter for screening for malignant neoplasm of colon: Secondary | ICD-10-CM | POA: Diagnosis not present

## 2017-06-07 DIAGNOSIS — K5904 Chronic idiopathic constipation: Secondary | ICD-10-CM | POA: Diagnosis not present

## 2017-06-07 MED FILL — GAVILYTE-G SOLUTION: 236 | 1 days supply | Qty: 4000 | Fill #0

## 2017-06-22 DIAGNOSIS — Z1211 Encounter for screening for malignant neoplasm of colon: Secondary | ICD-10-CM | POA: Diagnosis not present

## 2017-06-22 LAB — HM COLONOSCOPY

## 2018-01-03 ENCOUNTER — Other Ambulatory Visit: Payer: Self-pay | Admitting: Obstetrics and Gynecology

## 2018-01-03 DIAGNOSIS — Z1231 Encounter for screening mammogram for malignant neoplasm of breast: Secondary | ICD-10-CM

## 2018-02-15 ENCOUNTER — Ambulatory Visit
Admission: RE | Admit: 2018-02-15 | Discharge: 2018-02-15 | Disposition: A | Discharge status: Home / Self Care | Payer: 59 | Source: Ambulatory Visit | Attending: Obstetrics and Gynecology | Admitting: Obstetrics and Gynecology

## 2018-02-15 ENCOUNTER — Ambulatory Visit: Payer: 59

## 2018-02-15 DIAGNOSIS — Z1231 Encounter for screening mammogram for malignant neoplasm of breast: Secondary | ICD-10-CM | POA: Diagnosis not present

## 2018-02-19 ENCOUNTER — Encounter (HOSPITAL_COMMUNITY): Payer: Self-pay | Admitting: *Deleted

## 2018-02-19 ENCOUNTER — Emergency Department (HOSPITAL_COMMUNITY)
Admission: EM | Admit: 2018-02-19 | Discharge: 2018-02-19 | Disposition: A | Discharge status: Home / Self Care | Payer: 59 | Attending: Emergency Medicine | Admitting: Emergency Medicine

## 2018-02-19 DIAGNOSIS — R030 Elevated blood-pressure reading, without diagnosis of hypertension: Secondary | ICD-10-CM | POA: Insufficient documentation

## 2018-02-19 DIAGNOSIS — E162 Hypoglycemia, unspecified: Secondary | ICD-10-CM | POA: Insufficient documentation

## 2018-02-19 DIAGNOSIS — R42 Dizziness and giddiness: Secondary | ICD-10-CM

## 2018-02-19 LAB — URINALYSIS, ROUTINE W REFLEX MICROSCOPIC
BACTERIA UA: NONE SEEN
Bilirubin Urine: NEGATIVE
Glucose, UA: NEGATIVE mg/dL
Ketones, ur: NEGATIVE mg/dL
LEUKOCYTES UA: NEGATIVE
Nitrite: NEGATIVE
PROTEIN: NEGATIVE mg/dL
Specific Gravity, Urine: 1.002 — ABNORMAL LOW (ref 1.005–1.030)
pH: 7 (ref 5.0–8.0)

## 2018-02-19 LAB — CBC
HEMATOCRIT: 44.9 % (ref 36.0–46.0)
HEMOGLOBIN: 14.5 g/dL (ref 12.0–15.0)
MCH: 29.1 pg (ref 26.0–34.0)
MCHC: 32.3 g/dL (ref 30.0–36.0)
MCV: 90.2 fL (ref 80.0–100.0)
NRBC: 0 % (ref 0.0–0.2)
Platelets: 237 10*3/uL (ref 150–400)
RBC: 4.98 MIL/uL (ref 3.87–5.11)
RDW: 13.5 % (ref 11.5–15.5)
WBC: 4.9 10*3/uL (ref 4.0–10.5)

## 2018-02-19 LAB — HEPATIC FUNCTION PANEL
ALT: 18 U/L (ref 0–44)
AST: 31 U/L (ref 15–41)
Albumin: 3.8 g/dL (ref 3.5–5.0)
Alkaline Phosphatase: 59 U/L (ref 38–126)
Bilirubin, Direct: <0.1 mg/dL (ref 0.0–0.2)
Total Bilirubin: 0.5 mg/dL (ref 0.3–1.2)
Total Protein: 7.1 g/dL (ref 6.5–8.1)

## 2018-02-19 LAB — BASIC METABOLIC PANEL
Anion gap: 9 (ref 5–15)
BUN: 19 mg/dL (ref 6–20)
CHLORIDE: 104 mmol/L (ref 98–111)
CO2: 28 mmol/L (ref 22–32)
Calcium: 9.3 mg/dL (ref 8.9–10.3)
Creatinine, Ser: 1.18 mg/dL — ABNORMAL HIGH (ref 0.44–1.00)
GFR calc Af Amer: >60 mL/min (ref >60)
GFR calc non Af Amer: 53 mL/min — ABNORMAL LOW (ref >60)
Glucose, Bld: 97 mg/dL (ref 70–99)
POTASSIUM: 3.8 mmol/L (ref 3.5–5.1)
Sodium: 141 mmol/L (ref 135–145)

## 2018-02-19 LAB — CBG MONITORING, ED
GLUCOSE-CAPILLARY: 61 mg/dL — AB (ref 70–99)
GLUCOSE-CAPILLARY: 83 mg/dL (ref 70–99)
GLUCOSE-CAPILLARY: 59 mg/dL — AB (ref 70–99)
Glucose-Capillary: 90 mg/dL (ref 70–99)
Glucose-Capillary: 74 mg/dL (ref 70–99)

## 2018-02-19 NOTE — ED Provider Notes (Signed)
[facility] Provider Note   CSN: [id number] Arrival date & time: 02/19/18  1326     History   Chief Complaint Chief Complaint  Patient presents with  . Dizziness    HPI Rebecca Zamora is a 52 y.o. female.  Patient c/o intermittently feeling lightheaded/faint/dizzy for the past 2 weeks. Symptoms gradual onset, episodic. States will feel lightheaded as if about to faint. No syncope. No fall or injury. No room spinning or imbalance of gait. Denies any specific exacerbating or alleviating factors other than noticing more when upright. States this AM had eaten breakfast, but when came to ER blood sugar was 61. Denies any med use/no diabetic med use. No hx etoh abuse or liver disease. Normal appetite. No recent wt change. No heat/cold intolerance or sweats. States hx borderline hypertension in past. Pt denies headache. No chest pain or discomfort. No sob or unusual doe. No fever or chills. No palpitations or hx dysrhythmia. No recent blood loss, vaginal bleeding or melena.   The history is provided by the patient.  Dizziness  Associated symptoms: no blood in stool, no chest pain, no diarrhea, no headaches, no palpitations, no shortness of breath, no vomiting and no weakness     Past Medical History:  Diagnosis Date  . No pertinent past medical history     Patient Active Problem List   Diagnosis Date Noted  . Pelvic pain in female 01/23/2011  . Fibroid uterus 01/23/2011  . Menorrhagia 01/23/2011    Past Surgical History:  Procedure Laterality Date  . MYOMECTOMY  1999     OB History   No obstetric history on file.      Home Medications    Prior to Admission medications   Medication Sig Start Date End Date Taking? Authorizing Provider  Cholecalciferol (VITAMIN D) 2000 UNITS tablet Take 4,000 Units by mouth daily. Pt has stopped taking prior to procedure.     [provider]  fish oil-omega-3 fatty acids 1000 MG capsule  Take 2 g by mouth daily. Pt has stopped taking prior to procedure.     [provider]  senna-docusate (SENOKOT-S) 8.6-50 MG per tablet Take 1 tablet by mouth daily. Uses Senokot.     [provider]  traMADol (ULTRAM) 50 MG tablet Take 50 mg by mouth every 6 (six) hours as needed. Maximum dose= 8 tablets per day  Pt. Stopped taking because of nausea.     [provider]    Family History History reviewed. No pertinent family history.  Social History Social History   Tobacco Use  . Smoking status: Never Smoker  . Smokeless tobacco: Never Used  Substance Use Topics  . Alcohol use: Yes    Comment: occasionally  . Drug use: No     Allergies   Hydrocodone   Review of Systems Review of Systems  Constitutional: Negative for chills and fever.  HENT: Negative for sore throat.   Eyes: Negative for visual disturbance.  Respiratory: Negative for cough and shortness of breath.   Cardiovascular: Negative for chest pain, palpitations and leg swelling.  Gastrointestinal: Negative for abdominal pain, blood in stool, diarrhea and vomiting.  Endocrine: Negative for polyuria.  Genitourinary: Negative for dysuria, flank pain and vaginal bleeding.  Musculoskeletal: Negative for back pain and neck pain.  Skin: Negative for rash.  Neurological: Positive for dizziness and light-headedness. Negative for speech difficulty, weakness, numbness and headaches.  Hematological: Does not bruise/bleed easily.  Psychiatric/Behavioral: Negative for confusion.  Physical Exam Updated Vital Signs BP (!) 171/118 (BP Location: Right Arm)   Pulse 68   Temp 97.9 F (36.6 C) (Oral)   Resp 20   LMP 01/21/2011   SpO2 100%   Physical Exam Vitals signs and nursing note reviewed.  Constitutional:      Appearance: Normal appearance. She is well-developed.  HENT:     Head: Atraumatic.     Right Ear: Tympanic membrane normal.     Left Ear: Tympanic membrane normal.     Nose:  Nose normal.     Mouth/Throat:     Mouth: Mucous membranes are moist.  Eyes:     General: No scleral icterus.    Conjunctiva/sclera: Conjunctivae normal.     Pupils: Pupils are equal, round, and reactive to light.  Neck:     Musculoskeletal: Normal range of motion and neck supple. No neck rigidity or muscular tenderness.     Vascular: No carotid bruit.     Trachea: No tracheal deviation.  Cardiovascular:     Rate and Rhythm: Normal rate and regular rhythm.     Pulses: Normal pulses.     Heart sounds: Normal heart sounds. No murmur. No friction rub. No gallop.   Pulmonary:     Effort: Pulmonary effort is normal. No respiratory distress.     Breath sounds: Normal breath sounds.  Abdominal:     General: Bowel sounds are normal. There is no distension.     Palpations: Abdomen is soft. There is no mass.     Tenderness: There is no abdominal tenderness. There is no guarding.  Genitourinary:    Comments: No cva tenderness.  Musculoskeletal:        General: No swelling or tenderness.  Lymphadenopathy:     Cervical: No cervical adenopathy.  Skin:    General: Skin is warm and dry.     Coloration: Skin is not jaundiced.     Findings: No rash.  Neurological:     Mental Status: She is alert.     Comments: Speech clear/fluent. Motor intact bil, stre 5/5. No pronator drift. Sensation grossly intact. Steady gait.   Psychiatric:        Mood and Affect: Mood normal.      ED Treatments / Results  Labs (all labs ordered are listed, but only abnormal results are displayed) Results for orders placed or performed during the hospital encounter of 15/40/08  Basic metabolic panel  Result Value Ref Range   Sodium 141 135 - 145 mmol/L   Potassium 3.8 3.5 - 5.1 mmol/L   Chloride 104 98 - 111 mmol/L   CO2 28 22 - 32 mmol/L   Glucose, Bld 97 70 - 99 mg/dL   BUN 19 6 - 20 mg/dL   Creatinine, Ser 1.18 (H) 0.44 - 1.00 mg/dL   Calcium 9.3 8.9 - 10.3 mg/dL   GFR calc non Af Amer 53 (L) >60 mL/min     GFR calc Af Amer >60 >60 mL/min   Anion gap 9 5 - 15  CBC  Result Value Ref Range   WBC 4.9 4.0 - 10.5 K/uL   RBC 4.98 3.87 - 5.11 MIL/uL   Hemoglobin 14.5 12.0 - 15.0 g/dL   HCT 44.9 36.0 - 46.0 %   MCV 90.2 80.0 - 100.0 fL   MCH 29.1 26.0 - 34.0 pg   MCHC 32.3 30.0 - 36.0 g/dL   RDW 13.5 11.5 - 15.5 %   Platelets 237 150 - 400 K/uL  nRBC 0.0 0.0 - 0.2 %  Urinalysis, Routine w reflex microscopic  Result Value Ref Range   Color, Urine COLORLESS (A) YELLOW   APPearance CLEAR CLEAR   Specific Gravity, Urine 1.002 (L) 1.005 - 1.030   pH 7.0 5.0 - 8.0   Glucose, UA NEGATIVE NEGATIVE mg/dL   Hgb urine dipstick SMALL (A) NEGATIVE   Bilirubin Urine NEGATIVE NEGATIVE   Ketones, ur NEGATIVE NEGATIVE mg/dL   Protein, ur NEGATIVE NEGATIVE mg/dL   Nitrite NEGATIVE NEGATIVE   Leukocytes, UA NEGATIVE NEGATIVE   WBC, UA 0-5 0 - 5 WBC/hpf   Bacteria, UA NONE SEEN NONE SEEN   Squamous Epithelial / LPF 0-5 0 - 5  Hepatic function panel  Result Value Ref Range   Total Protein 7.1 6.5 - 8.1 g/dL   Albumin 3.8 3.5 - 5.0 g/dL   AST 31 15 - 41 U/L   ALT 18 0 - 44 U/L   Alkaline Phosphatase 59 38 - 126 U/L   Total Bilirubin 0.5 0.3 - 1.2 mg/dL   Bilirubin, Direct <0.1 0.0 - 0.2 mg/dL   Indirect Bilirubin NOT CALCULATED 0.3 - 0.9 mg/dL  CBG monitoring, ED  Result Value Ref Range   Glucose-Capillary 61 (L) 70 - 99 mg/dL  CBG monitoring, ED  Result Value Ref Range   Glucose-Capillary 83 70 - 99 mg/dL  CBG monitoring, ED  Result Value Ref Range   Glucose-Capillary 59 (L) 70 - 99 mg/dL  CBG monitoring, ED  Result Value Ref Range   Glucose-Capillary 90 70 - 99 mg/dL   Mm 3d Screen Breast Bilateral  Result Date: 02/18/2018 CLINICAL DATA:  Screening. EXAM: DIGITAL SCREENING BILATERAL MAMMOGRAM WITH TOMO AND CAD COMPARISON:  Previous exam(s). ACR Breast Density Category c: The breast tissue is heterogeneously dense, which may obscure small masses. FINDINGS: There are no findings suspicious  for malignancy. Images were processed with CAD. IMPRESSION: No mammographic evidence of malignancy. A result letter of this screening mammogram will be mailed directly to the patient. RECOMMENDATION: Screening mammogram in one year. (Code:SM-B-01Y) BI-RADS CATEGORY  1: Negative. Electronically Signed   By: Fidela Salisbury M.D.   On: 02/18/2018 08:15    EKG EKG Interpretation  Date/Time:  Tuesday February 19 2018 13:32:29 EST Ventricular Rate:  74 PR Interval:  166 QRS Duration: 82 QT Interval:  396 QTC Calculation: 439 R Axis:   76 Text Interpretation:  Normal sinus rhythm Nonspecific T wave abnormality Baseline wander No previous tracing Confirmed by Lajean Saver [phone]) on 02/19/2018 3:08:13 PM   Radiology No results found.  Procedures Procedures (including critical care time)  Medications Ordered in ED Medications - No data to display   Initial Impression / Assessment and Plan / ED Course  I have reviewed the triage vital signs and the nursing notes.  Pertinent labs & imaging results that were available during my care of the patient were reviewed by me and considered in my medical decision making (see chart for details).  Labs.   Reviewed nursing notes and prior charts for additional history.   CBG 61 - po fluids, food. Repeat cbg better.   Pt requests liver fxn tests be added to labs - done.   BP improved from initial, currently 154/92.   Pt denies taking any insulin/diabetic meds. cbgs were low 61/59 - now improved post eating/drinking.   Pt currently asymptomatic, bp improved, blood sugar improved, 90.  Pt currently appears stable for d/c.   Rec close pcp f/u this week, possible  72 hr fast/testing for excess endogenous insulin, and f/u of recent symptoms, possible further outpatient workup.    Final Clinical Impressions(s) / ED Diagnoses   Final diagnoses:  None    ED Discharge Orders    None       Lajean Saver, MD 02/19/18 1801

## 2018-02-19 NOTE — Progress Notes (Signed)
While rounding ion ED recognized patient as employee and per her request i prayed for and with  Her.  Will follow as needed.  Jaclynn Major, [place], [facility], Pager [phone]

## 2018-02-19 NOTE — Discharge Instructions (Addendum)
It was our pleasure to provide your ER care today - we hope that you feel better.  Your blood sugar was mildly low today. If you feel as if your blood sugar may be low, eat/drink something right away and check sugar.  Follow up with primary care doctor in the coming week, call office Thursday AM to arrange appointment - discuss further outpatient workup of low blood sugar (possible fasting insulin and blood sugar testing).  Also, your blood pressure was high today - follow up with primary care doctor in the next few days.   Return to ER if worse, new symptoms, fevers, weak/fainting, chest pain, trouble breathing, other concern.

## 2018-02-19 NOTE — ED Notes (Signed)
Pt given orange juice, [place] sandwhich and crackers and peanut butter

## 2018-02-19 NOTE — ED Notes (Signed)
Patient denies pain and is resting comfortably.  

## 2018-02-19 NOTE — ED Triage Notes (Signed)
Pt in c/o dizziness for the last two weeks, today the symptoms worsened and her BP was also elevated, also c/o abdominal distention, denies n/v, denies pain, denies headaches or vision changes

## 2018-02-21 ENCOUNTER — Encounter: Payer: Self-pay | Admitting: Internal Medicine

## 2018-02-21 ENCOUNTER — Ambulatory Visit (INDEPENDENT_AMBULATORY_CARE_PROVIDER_SITE_OTHER): Payer: 59 | Admitting: Internal Medicine

## 2018-02-21 VITALS — BP 130/76 | HR 55 | Temp 97.8°F | Ht 62.0 in | Wt 154.0 lb

## 2018-02-21 DIAGNOSIS — R109 Unspecified abdominal pain: Secondary | ICD-10-CM

## 2018-02-21 DIAGNOSIS — Z09 Encounter for follow-up examination after completed treatment for conditions other than malignant neoplasm: Secondary | ICD-10-CM | POA: Diagnosis not present

## 2018-02-21 DIAGNOSIS — R030 Elevated blood-pressure reading, without diagnosis of hypertension: Secondary | ICD-10-CM

## 2018-02-21 DIAGNOSIS — E162 Hypoglycemia, unspecified: Secondary | ICD-10-CM

## 2018-02-21 DIAGNOSIS — K5900 Constipation, unspecified: Secondary | ICD-10-CM | POA: Diagnosis not present

## 2018-02-21 NOTE — Patient Instructions (Addendum)
Dr Baird Cancer is comfortable doing 75 % of the test for insulinoma, so I have ordered.  In regards of your blood pressure being elevated during the hospital stay, she said no to giving you medication, but wants you to have a 24 hour urine collection for catecholamines.   Come back to see Janece in in 1-2 weeks for follow up.   Also Dr Collene Mares mentioned in your colonoscopy report, that if you developed any colon symptoms to call her right away to make apt.   In the mean time, eat high protein snacks every 3 hours like nuts, cheeze sticks, peanut-butter and avoid sweets, and limit amount of breads, crackers, or other carbohydrates likes rice, pasta, potatoes.   Also avoid white flour type foods and eat more protein, like [place], chicken, fish, and beans also have protein with complex carbohydrates which are better, than rice and pasta' Garbanzo beans, lentils.

## 2018-02-21 NOTE — Progress Notes (Signed)
Subjective:     Patient ID: Rebecca Zamora , female    DOB: December 14, 1965 , 53 y.o.   MRN: [id number]   Chief Complaint  Patient presents with  . Hospitalization Follow-up    HPI  Here for EF FU for hypoglycemia. Her sister who is an MD sent her a note requesting for pt to have work up for insulinoma. She has been eating every 3 hours. Her glucose 3 minutes ago was 92. She is not feeling as bad as when it gets lower. She came fasting since she did not know if she should eat and she checked her glucose 3 month ago and is 92.  Her sister who is an Hopitalist Dr Nevada Crane is very concerned that her gluose barely made it to 64 after drinking a lot of sweet drinks and concerned of her trancient HTN. Pt has been eating moderate carbs every 3 hours. Recalls as a child having milder symptoms like this if she did not eat for a long time.   Past Medical History:  Diagnosis Date  . No pertinent past medical history      History reviewed. No pertinent family history.   Current Outpatient Medications:  .  Ascorbic Acid (VITAMIN C PO), Take 1 tablet by mouth daily., Disp: , Rfl:  .  aspirin EC 81 MG tablet, Take 81 mg by mouth daily., Disp: , Rfl:  .  fish oil-omega-3 fatty acids 1000 MG capsule, Take 2 g by mouth daily. , Disp: , Rfl:  .  VITAMIN D PO, Take 1 tablet by mouth daily., Disp: , Rfl:    Allergies  Allergen Reactions  . Hydrocodone Nausea Only     Review of Systems  Constitutional: Positive for fatigue. Negative for appetite change, chills and diaphoresis.  Respiratory: Negative for cough and shortness of breath.   Gastrointestinal: Positive for abdominal distention, abdominal pain and constipation. Negative for blood in stool, diarrhea, nausea and rectal pain.       Constipation x 3 weeks. And Senacot helps. Has had to take it at least twice a week. Has not contacted Dr Collene Mares.   Endocrine: Negative for polydipsia, polyphagia and polyuria.  Genitourinary: Negative for difficulty  urinating, dysuria, frequency and urgency.  Musculoskeletal: Negative for arthralgias.  Skin: Negative for rash and wound.  Neurological: Negative for numbness.     Today's Vitals   02/21/18 1442  BP: 130/76  Pulse: (!) 55  Temp: 97.8 F (36.6 C)  TempSrc: Oral  SpO2: 98%  Weight: 154 lb (69.9 kg)  Height: 5\' 2"  (1.575 m)   Body mass index is 28.17 kg/m.   Objective:  Physical Exam   Constitutional: She is oriented to person, place, and time. She appears well-developed and well-nourished. No distress.  HENT:  Head: Normocephalic and atraumatic.  Right Ear: External ear normal.  Left Ear: External ear normal.  Nose: Nose normal.  Eyes: Conjunctivae are normal. Right eye exhibits no discharge. Left eye exhibits no discharge. No scleral icterus.  Neck: Neck supple. No thyromegaly present.  No carotid bruits bilaterally  Cardiovascular: Normal rate and regular rhythm.  No murmur heard. Pulmonary/Chest: Effort normal and breath sounds normal. No respiratory distress.  Heart- + BS, soft, has mild tenderness on lower abdomen, but is not guarded and I do not feel any masses.  Musculoskeletal: Normal range of motion. She exhibits no edema.  Lymphadenopathy: She has no cervical adenopathy.  Neurological: She is alert and oriented to person, place, and time.  Skin:  Skin is warm and dry. Capillary refill takes less than 2 seconds. No rash noted. She is not diaphoretic.  Psychiatric: She has a normal mood and affect. Her behavior is normal. Judgment and thought content normal.  Nursing note reviewed.  Assessment And Plan:    1. Hypoglycemia- unexplained.  - Insulin and C-Peptide - Proinsulin - Hemoglobin A1c  When I came back in the room, she was eating white bread and had finished eating a banana. I educated her on eating high protein diet and avoiding white flour and simple sugars. See instructions.  2. Elevated blood pressure, situational- Dr Baird Cancer said no me BP med, but to  order this: - Catecholamine+VMA, 24-Hr Urine 3- Abdominal pain- acute. Needs to see Dr Collene Mares  4- Constipation- acute. I reviewed her last colonoscopy and there Dr Collene Mares recommends for pt to see her if she has any new symptoms, so I told pt she needs to contact her and make apt.   Fu with Janece in 10 days.    Tommie Dejoseph RODRIGUEZ-SOUTHWORTH, PA-C

## 2018-02-25 DIAGNOSIS — R030 Elevated blood-pressure reading, without diagnosis of hypertension: Secondary | ICD-10-CM | POA: Diagnosis not present

## 2018-02-26 DIAGNOSIS — R0609 Other forms of dyspnea: Secondary | ICD-10-CM | POA: Diagnosis not present

## 2018-02-26 DIAGNOSIS — R42 Dizziness and giddiness: Secondary | ICD-10-CM | POA: Diagnosis not present

## 2018-02-26 DIAGNOSIS — K5909 Other constipation: Secondary | ICD-10-CM | POA: Diagnosis not present

## 2018-02-26 DIAGNOSIS — R002 Palpitations: Secondary | ICD-10-CM | POA: Diagnosis not present

## 2018-02-26 DIAGNOSIS — I10 Essential (primary) hypertension: Secondary | ICD-10-CM | POA: Diagnosis not present

## 2018-02-26 DIAGNOSIS — R030 Elevated blood-pressure reading, without diagnosis of hypertension: Secondary | ICD-10-CM | POA: Diagnosis not present

## 2018-02-26 DIAGNOSIS — K5904 Chronic idiopathic constipation: Secondary | ICD-10-CM | POA: Diagnosis not present

## 2018-02-26 DIAGNOSIS — I517 Cardiomegaly: Secondary | ICD-10-CM | POA: Diagnosis not present

## 2018-02-26 MED FILL — LOSARTAN POTASSIUM 50 MG TA: 50 | 90 days supply | Qty: 90 | Fill #0

## 2018-02-27 LAB — PROINSULIN: Proinsulin: 5.7 pmol/L (ref 0.0–10.0)

## 2018-02-27 LAB — HEMOGLOBIN A1C
Est. average glucose Bld gHb Est-mCnc: 105 mg/dL
Hgb A1c MFr Bld: 5.3 % (ref 4.8–5.6)

## 2018-02-27 LAB — INSULIN AND C-PEPTIDE, SERUM
C-Peptide: 4.1 ng/mL (ref 1.1–4.4)
INSULIN: 46.3 u[IU]/mL — ABNORMAL HIGH (ref 2.6–24.9)

## 2018-02-27 MED FILL — AMLODIPINE BESYLATE 5 MG TA: 5 | 30 days supply | Qty: 30 | Fill #0

## 2018-02-28 NOTE — Progress Notes (Signed)
You may start her on once weekly injection GLP-1 to help with insulin resistance.  You want to send her to endocrinology for what exactly?

## 2018-03-01 LAB — CATECHOLAMINES, FRACTIONATED, URINE, 24 HOUR
Dopamine , 24H Ur: 275 ug/24 hr (ref 0–510)
Dopamine, Rand Ur: 131 ug/L
EPINEPHRINE 24H UR: 2 ug/(24.h) (ref 0–20)
EPINEPHRINE RAND UR: 1 ug/L
Norepinephrine, 24H Ur: 23 ug/24 hr (ref 0–135)
Norepinephrine, Rand Ur: 11 ug/L

## 2018-03-05 ENCOUNTER — Ambulatory Visit (INDEPENDENT_AMBULATORY_CARE_PROVIDER_SITE_OTHER): Payer: 59 | Admitting: Internal Medicine

## 2018-03-05 ENCOUNTER — Telehealth: Payer: Self-pay

## 2018-03-05 ENCOUNTER — Encounter: Payer: Self-pay | Admitting: Internal Medicine

## 2018-03-05 VITALS — BP 118/82 | HR 74 | Temp 98.0°F | Ht 61.0 in | Wt 159.6 lb

## 2018-03-05 DIAGNOSIS — E8881 Metabolic syndrome: Secondary | ICD-10-CM

## 2018-03-05 DIAGNOSIS — I517 Cardiomegaly: Secondary | ICD-10-CM | POA: Diagnosis not present

## 2018-03-05 DIAGNOSIS — Z09 Encounter for follow-up examination after completed treatment for conditions other than malignant neoplasm: Secondary | ICD-10-CM

## 2018-03-05 NOTE — Progress Notes (Signed)
Subjective:     Patient ID: Rebecca Zamora , female    DOB: 1966-01-21 , 53 y.o.   MRN: [id number]   Chief Complaint  Patient presents with  . Hospitalization Follow-up    HPI Pt is here for FU hypoglycemia. Has been eating less carbs and more protein, but feels her glucose is still getting low. She saw Dr Collene Mares last week for abdominal pain and constipation and was given Linzess, but due to high BP  she referred  to a Cardiologist who could see her right away and he  placed her on Amlodipine 5 mg qd.  Her BP diary is as follows: Today 142/95 Yesterday- 124/86 Sat 130/97 Since 02/27/18.  Onset of SOB with activity several months ago and has hx of cardiomegally and last time she saw her cardiologist was 2016. The cardiomegaly was diagnosed via CXR, but never had an echo. Denies any orthopnea or leg swelling.   Past Medical History:  Diagnosis Date  . No pertinent past medical history      No family history on file.   Current Outpatient Medications:  .  amLODipine (NORVASC) 5 MG tablet, Take 5 mg by mouth daily., Disp: , Rfl:  .  Ascorbic Acid (VITAMIN C PO), Take 1 tablet by mouth daily., Disp: , Rfl:  .  aspirin EC 81 MG tablet, Take 81 mg by mouth daily., Disp: , Rfl:  .  fish oil-omega-3 fatty acids 1000 MG capsule, Take 2 g by mouth daily. , Disp: , Rfl:  .  VITAMIN D PO, Take 1 tablet by mouth daily., Disp: , Rfl:    Allergies  Allergen Reactions  . Hydrocodone Nausea Only     Review of Systems  Constitutional: Positive for fatigue. Negative for appetite change, chills, diaphoresis and fever.  Respiratory: Positive for chest tightness and shortness of breath. Negative for wheezing.        Gets tightness in chest when she is active, and has to hold her chest, lasting 1-2 minutes. She does not get sweaty or nauseous.   Cardiovascular: Negative for leg swelling.  Gastrointestinal: Negative for abdominal distention, abdominal pain, constipation, nausea and vomiting.   She d/c the Linzess and her constipation has resolved  Endocrine: Negative for polydipsia and polyphagia.  Musculoskeletal: Negative for myalgias.  Skin: Negative for rash.  Neurological: Negative for dizziness, weakness and headaches.     Today's Vitals   03/05/18 1530  BP: 118/82  Pulse: 74  Temp: 98 F (36.7 C)  TempSrc: Oral  SpO2: 97%  Weight: 159 lb 9.6 oz (72.4 kg)  Height: 5\' 1"  (1.549 m)  PainSc: 2   PainLoc: Neck   Body mass index is 30.16 kg/m.   Objective:  Physical Exam   Constitutional: She is oriented to person, place, and time. She appears well-developed and well-nourished. No distress.  HENT:  Head: Normocephalic and atraumatic.  Right Ear: External ear normal.  Left Ear: External ear normal.  Nose: Nose normal.  Eyes: Conjunctivae are normal. Right eye exhibits no discharge. Left eye exhibits no discharge. No scleral icterus.  Neck: Neck supple. No thyromegaly present.  No carotid bruits bilaterally  Cardiovascular: Normal rate and regular rhythm. Pulmonary/Chest: Effort normal and breath sounds normal. No respiratory distress.  Musculoskeletal: Normal range of motion. She exhibits no edema.  Lymphadenopathy:   She has no cervical adenopathy.  Neurological: She is alert and oriented to person, place, and time.  Skin: Skin is warm and dry. Capillary refill takes less  than 2 seconds. No rash noted. She is not diaphoretic.  Psychiatric: She has a normal mood and affect. Her behavior is normal. Judgment and thought content normal.  Nursing note reviewed. Assessment And Plan:    1. Cardiomegaly- past history of this, last CXR 2016. Her Dr Nevada Crane, her sister she never had an echo.  - Ambulatory referral to Cardiology - DG Chest 2 View; Future - Brain natriuretic peptide  2. Insulin resistance- acute. Pt is willing to do whatever her sister, Dr Nevada Crane recomeds and after taking with Dr Nevada Crane about Dr Baird Cancer recommendation to try GLP 1, she is willing. She will  set up an MA appointment and I will have her try the [place] name: Dr Francia Greaves [place], PA-C

## 2018-03-05 NOTE — Patient Instructions (Signed)
   [phone] Ext [phone]

## 2018-03-05 NOTE — Telephone Encounter (Signed)
Patient went to the ER on 02/19/18 please review her notes.  YRL,RMA

## 2018-03-06 ENCOUNTER — Ambulatory Visit
Admission: RE | Admit: 2018-03-06 | Discharge: 2018-03-06 | Disposition: A | Discharge status: Home / Self Care | Payer: 59 | Source: Ambulatory Visit | Attending: Internal Medicine | Admitting: Internal Medicine

## 2018-03-06 DIAGNOSIS — I517 Cardiomegaly: Secondary | ICD-10-CM

## 2018-03-06 DIAGNOSIS — R0602 Shortness of breath: Secondary | ICD-10-CM | POA: Diagnosis not present

## 2018-03-06 LAB — BMP8+ANION GAP
ANION GAP: 14 mmol/L (ref 10.0–18.0)
BUN/Creatinine Ratio: 17 (ref 9–23)
BUN: 18 mg/dL (ref 6–24)
CO2: 26 mmol/L (ref 20–29)
Calcium: 9.4 mg/dL (ref 8.7–10.2)
Chloride: 103 mmol/L (ref 96–106)
Creatinine, Ser: 1.03 mg/dL — ABNORMAL HIGH (ref 0.57–1.00)
GFR, EST AFRICAN AMERICAN: 73 mL/min/{1.73_m2} (ref >59)
GFR, EST NON AFRICAN AMERICAN: 63 mL/min/{1.73_m2} (ref >59)
Glucose: 98 mg/dL (ref 65–99)
Potassium: 3.8 mmol/L (ref 3.5–5.2)
Sodium: 143 mmol/L (ref 134–144)

## 2018-03-06 LAB — PRO B NATRIURETIC PEPTIDE: NT-Pro BNP: 18 pg/mL (ref 0–249)

## 2018-03-07 ENCOUNTER — Ambulatory Visit: Payer: 59 | Admitting: Internal Medicine

## 2018-03-07 ENCOUNTER — Other Ambulatory Visit: Payer: Self-pay | Admitting: Internal Medicine

## 2018-03-07 DIAGNOSIS — E162 Hypoglycemia, unspecified: Secondary | ICD-10-CM

## 2018-03-07 DIAGNOSIS — E8881 Metabolic syndrome: Secondary | ICD-10-CM

## 2018-03-07 NOTE — Progress Notes (Unsigned)
Her kidney function is better, needs continue hydrating well and avoid Ibuprofen type medications. The heart failure test called BNP is normal. Her chest xray says you have mild cardiac enlargement.    4:10 pm Dr Nevada Crane her sister called me today saying she would like pt to see endocrinology, Dr Philemon Kingdom. Pt had Ozempic0.25 mg  injection yesterday for the fist time,  and today had another hypoglycemia episode of glucocose of 66. I will make the referral requested.

## 2018-03-13 MED FILL — SM BLOOD PRESSURE MONITOR: 30 days supply | Qty: 1 | Fill #0

## 2018-03-21 ENCOUNTER — Ambulatory Visit: Payer: 59 | Admitting: Internal Medicine

## 2018-03-25 MED FILL — AMLODIPINE BESYLATE 5 MG TA: 5 | 30 days supply | Qty: 30 | Fill #1

## 2018-03-27 ENCOUNTER — Ambulatory Visit (INDEPENDENT_AMBULATORY_CARE_PROVIDER_SITE_OTHER): Payer: Commercial Managed Care - PPO | Admitting: Endocrinology

## 2018-03-27 ENCOUNTER — Encounter: Payer: Self-pay | Admitting: Endocrinology

## 2018-03-27 DIAGNOSIS — E162 Hypoglycemia, unspecified: Secondary | ICD-10-CM

## 2018-03-27 LAB — BASIC METABOLIC PANEL
BUN: 18 mg/dL (ref 6–23)
CO2: 31 mEq/L (ref 19–32)
Calcium: 9.5 mg/dL (ref 8.4–10.5)
Chloride: 103 mEq/L (ref 96–112)
Creatinine, Ser: 1.09 mg/dL (ref 0.40–1.20)
GFR: 63.88 mL/min (ref >60.00)
Glucose, Bld: 80 mg/dL (ref 70–99)
Potassium: 3.8 mEq/L (ref 3.5–5.1)
Sodium: 142 mEq/L (ref 135–145)

## 2018-03-27 NOTE — Progress Notes (Signed)
Subjective:    Patient ID: Rebecca Zamora, female    DOB: March 12, 1965, 53 y.o.   MRN: [id number]  HPI Pt is referred by Angelica Ran, PA, for hypoglycemia.  Pt reports 4 mos of intermittent moderate tremor of the hands, and assoc generalized weakness.  The worst episode was on 02/19/18, when she was seen in ER.  She had cbg of 61 and 59.  She is unable to cite precip factor.  sxs happens < 1 hr after eating, and does not happen in the fasting state.  More recently, she has been able to prevent sxs, by eating frequent small meals.  Symptoms are relieved by eating or drinking.  She takes no medication for the blood sugar.  She has never had weight loss surgery, alcohol intake, adrenal problems, pituitary problems, liver problems, or kidney problems.  She has a cbg meter.  cbg varies from 88-104.  She is fasting today.   Past Medical History:  Diagnosis Date  . No pertinent past medical history     Past Surgical History:  Procedure Laterality Date  . MYOMECTOMY  1999    Social History   Socioeconomic History  . Marital status: Married    Spouse name: Not on file  . Number of children: Not on file  . Years of education: Not on file  . Highest education level: Not on file  Occupational History  . Not on file  Social Needs  . Financial resource strain: Not on file  . Food insecurity:    Worry: Not on file    Inability: Not on file  . Transportation needs:    Medical: Not on file    Non-medical: Not on file  Tobacco Use  . Smoking status: Never Smoker  . Smokeless tobacco: Never Used  Substance and Sexual Activity  . Alcohol use: Yes    Comment: occasionally  . Drug use: No  . Sexual activity: Not Currently  Lifestyle  . Physical activity:    Days per week: Not on file    Minutes per session: Not on file  . Stress: Not on file  Relationships  . Social connections:    Talks on phone: Not on file    Gets together: Not on file    Attends religious service: Not on file   Active member of club or organization: Not on file    Attends meetings of clubs or organizations: Not on file    Relationship status: Not on file  . Intimate partner violence:    Fear of current or ex partner: Not on file    Emotionally abused: Not on file    Physically abused: Not on file    Forced sexual activity: Not on file  Other Topics Concern  . Not on file  Social History Narrative  . Not on file    Current Outpatient Medications on File Prior to Visit  Medication Sig Dispense Refill  . amLODipine (NORVASC) 5 MG tablet Take 5 mg by mouth daily.    . Ascorbic Acid (VITAMIN C PO) Take 1 tablet by mouth daily.    Marland Kitchen aspirin EC 81 MG tablet Take 81 mg by mouth daily.    . fish oil-omega-3 fatty acids 1000 MG capsule Take 2 g by mouth daily.     Marland Kitchen VITAMIN D PO Take 1 tablet by mouth daily.     No current facility-administered medications on file prior to visit.     Allergies  Allergen Reactions  .  Hydrocodone Nausea Only    Family History  Problem Relation Age of Onset  . Other Neg Hx   . Diabetes Neg Hx     BP 122/80 (BP Location: Left Arm, Patient Position: Sitting, Cuff Size: Normal)   Pulse 78   Ht 5\' 1"  (1.549 m)   Wt 159 lb (72.1 kg)   LMP 01/21/2011   SpO2 92%   BMI 30.04 kg/m    Review of Systems denies weight change, headache, fever, diarrhea, anxiety, rash, visual loss, abdominal pain, sob, arthralgias, galactorrhea, cramps, excessive diaphoresis, cold intolerance, n/v, rhinorrhea, easy bruising, numbness, seizure, or LOC.  She has frequent urination.  She has intermitt palpitations.  She has no menses since TAH in 2013.    Objective:   Physical Exam VS: see vs page GEN: no distress HEAD: head: no deformity eyes: no periorbital swelling, no proptosis external nose and ears are normal mouth: no lesion seen NECK: supple, thyroid is not enlarged CHEST WALL: no deformity LUNGS: clear to auscultation CV: reg rate and rhythm, no murmur ABD: abdomen  is soft, nontender.  no hepatosplenomegaly.  not distended.  no hernia MUSCULOSKELETAL: muscle bulk and strength are grossly normal.  no obvious joint swelling.  gait is normal and steady EXTEMITIES: no deformity.  no edema PULSES: no carotid bruit NEURO:  cn 2-12 grossly intact.   readily moves all 4's.  sensation is intact to touch on all 4's SKIN:  Normal texture and temperature.  No rash or suspicious lesion is visible.   NODES:  None palpable at the neck PSYCH: alert, well-oriented.  Does not appear anxious nor depressed.     Lab Results  Component Value Date   HGBA1C 5.3 02/21/2018   I have reviewed outside records, and summarized: Pt was noted to have near syncope, and referred here.  However, she had only mild hypoglycemia       Assessment & Plan:  Mild postprandial hypoglycemia, new to me Near syncope: prob not related to the above.  Hyperinsulinemia: recheck today  Patient Instructions  The first step is to check your blood sugar more when you are having symptoms. Please check some blood sugars when you have symptoms, and let us know.   The second step is to classify the low blood sugar as fasting or after eating (but yours sounds like the second one, which is important in that it is a risk factor for diabetes).   I would be happy to see you back here as needed.

## 2018-03-27 NOTE — Patient Instructions (Addendum)
The first step is to check your blood sugar more when you are having symptoms. Please check some blood sugars when you have symptoms, and let us know.   The second step is to classify the low blood sugar as fasting or after eating (but yours sounds like the second one, which is important in that it is a risk factor for diabetes).   I would be happy to see you back here as needed.

## 2018-03-28 LAB — INSULIN, RANDOM: Insulin: 3.2 u[IU]/mL (ref 2.0–19.6)

## 2018-04-22 MED FILL — AMLODIPINE BESYLATE 5 MG TA: 5 | 30 days supply | Qty: 30 | Fill #2

## 2018-05-02 DIAGNOSIS — H5201 Hypermetropia, right eye: Secondary | ICD-10-CM | POA: Diagnosis not present

## 2018-05-02 DIAGNOSIS — I10 Essential (primary) hypertension: Secondary | ICD-10-CM | POA: Diagnosis not present

## 2018-05-02 DIAGNOSIS — H1045 Other chronic allergic conjunctivitis: Secondary | ICD-10-CM | POA: Diagnosis not present

## 2018-05-02 DIAGNOSIS — H52223 Regular astigmatism, bilateral: Secondary | ICD-10-CM | POA: Diagnosis not present

## 2018-05-07 MED FILL — OLOPATADINE HCL 0.2% EYE DR: 0.2 | 25 days supply | Qty: 3 | Fill #0

## 2018-05-07 MED FILL — AMLODIPINE BESYLATE 5 MG TA: 5 | 30 days supply | Qty: 30 | Fill #3

## 2018-06-06 ENCOUNTER — Encounter: Payer: Self-pay | Admitting: Internal Medicine

## 2018-06-10 ENCOUNTER — Other Ambulatory Visit: Payer: Self-pay

## 2018-06-10 ENCOUNTER — Encounter: Payer: Self-pay | Admitting: Nurse Practitioner

## 2018-06-10 ENCOUNTER — Ambulatory Visit (INDEPENDENT_AMBULATORY_CARE_PROVIDER_SITE_OTHER): Payer: 59 | Admitting: Nurse Practitioner

## 2018-06-10 ENCOUNTER — Other Ambulatory Visit (HOSPITAL_COMMUNITY)
Admission: RE | Admit: 2018-06-10 | Discharge: 2018-06-10 | Disposition: A | Discharge status: Home / Self Care | Payer: 59 | Source: Ambulatory Visit | Attending: Internal Medicine | Admitting: Internal Medicine

## 2018-06-10 VITALS — BP 130/78 | HR 77 | Temp 98.1°F | Ht 62.2 in | Wt 158.4 lb

## 2018-06-10 DIAGNOSIS — I10 Essential (primary) hypertension: Secondary | ICD-10-CM | POA: Diagnosis not present

## 2018-06-10 DIAGNOSIS — Z113 Encounter for screening for infections with a predominantly sexual mode of transmission: Secondary | ICD-10-CM | POA: Diagnosis not present

## 2018-06-10 DIAGNOSIS — Z124 Encounter for screening for malignant neoplasm of cervix: Secondary | ICD-10-CM | POA: Diagnosis not present

## 2018-06-10 DIAGNOSIS — Z0000 Encounter for general adult medical examination without abnormal findings: Secondary | ICD-10-CM | POA: Diagnosis not present

## 2018-06-10 DIAGNOSIS — I129 Hypertensive chronic kidney disease with stage 1 through stage 4 chronic kidney disease, or unspecified chronic kidney disease: Secondary | ICD-10-CM | POA: Insufficient documentation

## 2018-06-10 DIAGNOSIS — N183 Chronic kidney disease, stage 3 unspecified: Secondary | ICD-10-CM | POA: Insufficient documentation

## 2018-06-10 LAB — POCT URINALYSIS DIPSTICK
Bilirubin, UA: NEGATIVE
Glucose, UA: NEGATIVE
Ketones, UA: NEGATIVE
Nitrite, UA: NEGATIVE
Protein, UA: NEGATIVE
Spec Grav, UA: 1.02 (ref 1.010–1.025)
Urobilinogen, UA: 0.2 E.U./dL
pH, UA: 6.5 (ref 5.0–8.0)

## 2018-06-10 LAB — POCT UA - MICROALBUMIN
Albumin/Creatinine Ratio, Urine, POC: <30
Creatinine, POC: 200 mg/dL
Microalbumin Ur, POC: 30 mg/L

## 2018-06-10 MED ORDER — AMLODIPINE BESYLATE 5 MG PO TABS: 5.0000 mg | ORAL_TABLET | Freq: Every day | ORAL | 1 refills | Status: DC

## 2018-06-10 MED FILL — AMLODIPINE BESYLATE 5 MG TA: 5 | 90 days supply | Qty: 90 | Fill #0

## 2018-06-10 NOTE — Progress Notes (Signed)
Subjective:     Patient ID: Rebecca Zamora , female    DOB: 10-25-1965 , 53 y.o.   MRN: [id number]   Chief Complaint  Patient presents with  . Annual Exam   The patient states she uses status post hysterectomy for birth control. Last LMP was Patient's last menstrual period was 01/21/2011.. Negative for Dysmenorrhea and Negative for Menorrhagia Mammogram last done 02/18/2018.  Negative for: breast discharge, breast lump(s), breast pain and breast self exam.  Pertinent negatives include abnormal bleeding (hematology), anxiety, decreased libido, depression, difficulty falling sleep, dyspareunia, history of infertility, nocturia, sexual dysfunction, sleep disturbances, urinary incontinence, urinary urgency, vaginal discharge and vaginal itching. Diet regular.The patient states her exercise level is  4-5 days a week.  She has been walking 3-4 times a week since the current Pandemic COVID-19.     The patient's tobacco use is:  Social History   Tobacco Use  Smoking Status Never Smoker  Smokeless Tobacco Never Used  . She has been exposed to passive smoke. The patient's alcohol use is:  Social History   Substance and Sexual Activity  Alcohol Use Yes   Comment: occasionally  . Additional information: Last pap more than 3 years, next one scheduled for today HPI  HPI   Past Medical History:  Diagnosis Date  . No pertinent past medical history      Family History  Problem Relation Age of Onset  . Hypertension Mother   . Stroke Father   . Gout Father   . Other Neg Hx   . Diabetes Neg Hx      Current Outpatient Medications:  .  amLODipine (NORVASC) 5 MG tablet, Take 5 mg by mouth daily., Disp: , Rfl:  .  Ascorbic Acid (VITAMIN C PO), Take 1 tablet by mouth daily., Disp: , Rfl:  .  aspirin EC 81 MG tablet, Take 81 mg by mouth daily., Disp: , Rfl:  .  fish oil-omega-3 fatty acids 1000 MG capsule, Take 2 g by mouth daily. , Disp: , Rfl:  .  VITAMIN D PO, Take 1 tablet by mouth  daily., Disp: , Rfl:    Allergies  Allergen Reactions  . Hydrocodone Nausea Only     Review of Systems  Constitutional: Negative.   HENT: Negative.   Eyes: Negative.   Respiratory: Negative.   Cardiovascular: Negative.   Gastrointestinal: Negative.   Endocrine: Negative.   Genitourinary: Negative.   Musculoskeletal: Negative.   Skin: Negative.   Allergic/Immunologic: Negative.   Neurological: Negative.   Hematological: Negative.   Psychiatric/Behavioral: Negative.      Today's Vitals   06/10/18 1107  BP: 130/78  Pulse: 77  Temp: 98.1 F (36.7 C)  TempSrc: Oral  Weight: 158 lb 6.4 oz (71.8 kg)  Height: 5' 2.2" (1.58 m)   Body mass index is 28.79 kg/m.   Objective:  Physical Exam Vitals signs reviewed.  Constitutional:      Appearance: Normal appearance. She is well-developed.  HENT:     Head: Normocephalic and atraumatic.     Right Ear: Hearing, tympanic membrane, ear canal and external ear normal.     Left Ear: Hearing, tympanic membrane, ear canal and external ear normal.     Nose: Nose normal.     Mouth/Throat:     Mouth: Mucous membranes are moist.  Eyes:     General: Lids are normal.     Conjunctiva/sclera: Conjunctivae normal.     Pupils: Pupils are equal, round, and reactive to light.  Funduscopic exam:    Right eye: No papilledema.        Left eye: No papilledema.  Neck:     Musculoskeletal: Full passive range of motion without pain, normal range of motion and neck supple.     Thyroid: No thyroid mass.     Vascular: No carotid bruit.  Cardiovascular:     Rate and Rhythm: Normal rate and regular rhythm.     Pulses: Normal pulses.     Heart sounds: Normal heart sounds. No murmur.  Pulmonary:     Effort: Pulmonary effort is normal.     Breath sounds: Normal breath sounds.  Chest:     Breasts: Breasts are symmetrical.        Right: Normal. No mass or nipple discharge.        Left: Normal. No mass or nipple discharge.  Abdominal:      General: Abdomen is flat. Bowel sounds are normal.     Palpations: Abdomen is soft.  Genitourinary:    General: Normal vulva.     Pubic Area: No rash.      Labia:        Right: No tenderness.        Left: No tenderness.      Vagina: Normal.     Cervix: Normal.     Uterus: Normal.      Adnexa: Right adnexa normal and left adnexa normal.  Musculoskeletal: Normal range of motion.        General: No swelling.     Right lower leg: No edema.     Left lower leg: No edema.  Lymphadenopathy:     Upper Body:     Right upper body: No supraclavicular or axillary adenopathy.     Left upper body: No supraclavicular or axillary adenopathy.  Skin:    General: Skin is warm and dry.     Capillary Refill: Capillary refill takes less than 2 seconds.  Neurological:     General: No focal deficit present.     Mental Status: She is alert and oriented to person, place, and time.     Cranial Nerves: No cranial nerve deficit.     Sensory: No sensory deficit.  Psychiatric:        Mood and Affect: Mood normal.        Behavior: Behavior normal.        Thought Content: Thought content normal.        Judgment: Judgment normal.         Assessment And Plan:     1. Health maintenance examination . Behavior modifications discussed and diet history reviewed.   . Pt will continue to exercise regularly and modify diet with low GI, plant based foods and decrease intake of processed foods.  . Recommend intake of daily multivitamin, Vitamin D, and calcium.  . Recommend for preventive screenings, as well as recommend immunizations that include influenza, TDAP, and Shingles  2. Encounter for Papanicolaou smear of cervix  No abnormal findings on exam - Cervicovaginal ancillary only  3. Screening examination for STD (sexually transmitted disease)  - Cytology -Pap Smear  4. Essential hypertension . B/P is controlled.  . The importance of regular exercise and dietary modification was stressed to the patient.   - POCT Urinalysis Dipstick (81002) - POCT UA - Microalbumin    Minette Brine, FNP    THE PATIENT IS ENCOURAGED TO PRACTICE SOCIAL DISTANCING DUE TO THE COVID-19 PANDEMIC.

## 2018-06-10 NOTE — Patient Instructions (Signed)

## 2018-06-12 LAB — CYTOLOGY - PAP: Diagnosis: NEGATIVE

## 2018-06-12 LAB — CERVICOVAGINAL ANCILLARY ONLY
Bacterial vaginitis: NEGATIVE
Candida vaginitis: NEGATIVE
Chlamydia: NEGATIVE
Neisseria Gonorrhea: NEGATIVE
Trichomonas: NEGATIVE

## 2018-06-19 ENCOUNTER — Encounter: Payer: Self-pay | Admitting: Nurse Practitioner

## 2018-06-21 ENCOUNTER — Telehealth: Payer: Self-pay

## 2018-06-21 NOTE — Telephone Encounter (Signed)
Verbal consent to have visit given

## 2018-06-21 NOTE — Telephone Encounter (Signed)
Pt called said that her pulse was low. Due to [place] not having providers in the office today I advised her to go to urgent care and scheduled her for a virtual visit for a referral to cardiologist

## 2018-06-24 ENCOUNTER — Other Ambulatory Visit: Payer: Self-pay

## 2018-06-24 ENCOUNTER — Encounter: Payer: Self-pay | Admitting: Nurse Practitioner

## 2018-06-24 ENCOUNTER — Ambulatory Visit (INDEPENDENT_AMBULATORY_CARE_PROVIDER_SITE_OTHER): Payer: 59 | Admitting: Nurse Practitioner

## 2018-06-24 VITALS — HR 78

## 2018-06-24 DIAGNOSIS — R079 Chest pain, unspecified: Secondary | ICD-10-CM

## 2018-06-24 DIAGNOSIS — R0789 Other chest pain: Secondary | ICD-10-CM

## 2018-06-24 DIAGNOSIS — Z7189 Other specified counseling: Secondary | ICD-10-CM

## 2018-06-24 HISTORY — DX: Other chest pain: R07.89

## 2018-06-24 NOTE — Progress Notes (Addendum)
Virtual Visit via Video - [email or web address]   This visit type was conducted due to national recommendations for restrictions regarding the COVID-19 Pandemic (e.g. social distancing) in an effort to limit this patient's exposure and mitigate transmission in our community.  Patients identity confirmed using two different identifiers.  This format is felt to be most appropriate for this patient at this time.  All issues noted in this document were discussed and addressed.  No physical exam was performed (except for noted visual exam findings with Video Visits).  Patient consents to video/telephone visit  Date:  06/24/2018   ID:  Rebecca Zamora, DOB [birth year], MRN [id number]  Patient Location:  [facility]  Provider location:   Office   Chief Complaint:  Would like referral to cardiology  History of Present Illness:    Rebecca Zamora is a 53 y.o. female who presents via video conferencing for a telehealth visit today.    The patient does not have symptoms concerning for COVID-19 infection (fever, chills, cough, or new shortness of breath).   She had a low heart rate as well.    She has already spoken to Dr. Mila Palmer office.    Chest Pain   This is a new problem. The current episode started more than 1 month ago. The onset quality is gradual. Pain location: left side chest pain. The pain is at a severity of 10/10 (She says the first one was really bad). Pertinent negatives include no cough or palpitations.  Pertinent negatives for past medical history include no aneurysm.     Past Medical History:  Diagnosis Date  . No pertinent past medical history    Past Surgical History:  Procedure Laterality Date  . MYOMECTOMY  1999     No outpatient medications have been marked as taking for the 06/24/18 encounter (Office Visit) with Minette Brine, [place].     Allergies:   Hydrocodone   Social History   Tobacco Use  . Smoking status: Never Smoker  . Smokeless tobacco: Never Used   Substance Use Topics  . Alcohol use: Yes    Comment: occasionally  . Drug use: No     Family Hx: The patient's family history includes Gout in her father; Hypertension in her mother; Stroke in her father. There is no history of Other or Diabetes.  ROS:   Please see the history of present illness.    Review of Systems  Constitutional: Negative.   Respiratory: Negative.  Negative for cough.   Cardiovascular: Positive for chest pain. Negative for palpitations.  Psychiatric/Behavioral: Negative.     All other systems reviewed and are negative.   Labs/Other Tests and Data Reviewed:    Recent Labs: 02/19/2018: ALT 18; Hemoglobin 14.5; Platelets 237 03/05/2018: NT-Pro BNP 18 03/27/2018: BUN 18; Creatinine, Ser 1.09; Potassium 3.8; Sodium 142   Recent Lipid Panel No results found for: CHOL, TRIG, HDL, CHOLHDL, LDLCALC, LDLDIRECT  Wt Readings from Last 3 Encounters:  06/10/18 158 lb 6.4 oz (71.8 kg)  03/27/18 159 lb (72.1 kg)  03/05/18 159 lb 9.6 oz (72.4 kg)     Exam:    Vital Signs:  Pulse 78   LMP 01/21/2011     Physical Exam  Constitutional: She is oriented to person, place, and time and well-developed, well-nourished, and in no distress.  Cardiovascular:  No JVD noted  Pulmonary/Chest: No respiratory distress.  Neurological: She is alert and oriented to person, place, and time.  Psychiatric: Mood, memory, affect and  judgment normal.    ASSESSMENT & PLAN:    1. Chest pain, unspecified type  She reports having chest pain on Friday up to a 10 in which she did not seek medical care.  She would like a referral back to Dr. Nadyne Coombes  I have expressed to her it is best that I see her in the office to check for a new EKG for any changes since Dec however she would like to be referred.  Since she has seen cardiology in the past will refer.    Advised if she has chest pain again she needs to seek medical attention at the ER   Also advised to take ASA 81mg  daily  COVID-19  Education: The signs and symptoms of COVID-19 were discussed with the patient and how to seek care for testing (follow up with PCP or arrange E-visit).  The importance of social distancing was discussed today.  Patient Risk:   After full review of this patients clinical status, I feel that they are at least moderate risk at this time.  Time:   Today, I have spent 15 minutes/ seconds with the patient with telehealth technology discussing above diagnoses.     Medication Adjustments/Labs and Tests Ordered: Current medicines are reviewed at length with the patient today.  Concerns regarding medicines are outlined above.   Tests Ordered: Orders Placed This Encounter  Procedures  . Ambulatory referral to Cardiology    Medication Changes: No orders of the defined types were placed in this encounter.   Disposition:  Follow up prn  Signed, Minette Brine, FNP

## 2018-06-26 ENCOUNTER — Encounter: Payer: Self-pay | Admitting: Cardiology

## 2018-06-27 ENCOUNTER — Ambulatory Visit (INDEPENDENT_AMBULATORY_CARE_PROVIDER_SITE_OTHER): Payer: 59 | Admitting: Cardiology

## 2018-06-27 ENCOUNTER — Other Ambulatory Visit: Payer: Self-pay

## 2018-06-27 ENCOUNTER — Encounter: Payer: Self-pay | Admitting: Cardiology

## 2018-06-27 VITALS — BP 122/75 | HR 48 | Ht 62.0 in | Wt 155.0 lb

## 2018-06-27 DIAGNOSIS — I10 Essential (primary) hypertension: Secondary | ICD-10-CM | POA: Diagnosis not present

## 2018-06-27 DIAGNOSIS — R0609 Other forms of dyspnea: Secondary | ICD-10-CM

## 2018-06-27 DIAGNOSIS — R001 Bradycardia, unspecified: Secondary | ICD-10-CM

## 2018-06-27 DIAGNOSIS — R0789 Other chest pain: Secondary | ICD-10-CM | POA: Diagnosis not present

## 2018-06-27 DIAGNOSIS — I517 Cardiomegaly: Secondary | ICD-10-CM | POA: Insufficient documentation

## 2018-06-27 DIAGNOSIS — R0600 Dyspnea, unspecified: Secondary | ICD-10-CM

## 2018-06-27 DIAGNOSIS — R002 Palpitations: Secondary | ICD-10-CM | POA: Diagnosis not present

## 2018-06-27 NOTE — Progress Notes (Signed)
Primary Physician/Referring:  Shelby Mattocks, PA-C  Patient ID: Rebecca Zamora, female    DOB: Feb 13, 1966, 53 y.o.   MRN: [id number]  Chief Complaint  Patient presents with  . CARDIOMEGALY    LOW PULSE RATE  . New Patient (Initial Visit)   This visit type was conducted due to national recommendations for restrictions regarding the COVID-19 Pandemic (e.g. social distancing).  This format is felt to be most appropriate for this patient at this time.  All issues noted in this document were discussed and addressed.  No physical exam was performed (except for noted visual exam findings with Telehealth visits).  The patient has consented to conduct a Telehealth visit and understands insurance will be billed.   I discussed the limitations of evaluation and management by telemedicine and the availability of in person appointments. The patient expressed understanding and agreed to proceed.  Virtual Visit via Video Note is as below  I connected with Rebecca Zamora, on 06/30/18 at 1150 by telephone and verified that I am speaking with the correct person using two identifiers. Patient had difficulty with connecting to virtual video despite multiple attempts.    I have discussed with her regarding the safety during COVID Pandemic and steps and precautions including social distancing with the patient.    HPI: Rebecca Zamora  is a 53 y.o. female  with hypertension, diabetes, referred to [place] for evaluation of chest pain and cardiomegaly noted on chest x-ray. She was previously seen in 2016 by [place] and reports undergoing stress test and monitor at that time.  Patient was evaluated in the emergency room in December 2019 for dizziness.  Blood pressure was markedly elevated at that time.  Chest x-ray performed revealed mild cardiac enlargement.  She has had BNP performed that was normal.    Patient has had palpitations of heart racing and skipped beats along with occasional chest painsince 2016.  Reports testing at that time was all normal. Reports that her symptoms had somewhat improved after that time; however, she is now have reoccurrent symptoms similar to before, but not as bad as in 2016. Palpitations occur several times throughout the day and vary in duration.  She has noticed for the last few months inability to do previously tolerated activities due to fatigue and shortness of breath. No exertional chest pain. She has also noticed over the last few months that her heart rate is low. She does have some occasional dizziness and lightheadedness. No syncope. She has had EKG showing HR of 74 in ER in Jan 2020. Reports heart rate was previously around 60-65. She was evaluated by Dr. Loanne Drilling (Endo) for postprandial hypoglycemia, who did not feel dizziness was related to this.   Has had intermittent chest pain occurring daily that occurs on the left side and described as sharp. Does not occur with activities or with exercise. Occurs with resting and is difficult to take a deep breath. Can last for a few minutes.  Originally from [place], but has lived in the [place]. for the last 25 years. Patient is very active and reports exercising with spin classes and aerobic exercises daily for several hours for the last 5-6 years. Denies any long distance running. She now notices that she is short of breath with trying to run for 1 minute. She is unsure of any family history of heart disease or sudden cardiac death.   Past Medical History:  Diagnosis Date  . Cardiomegaly   . HTN (hypertension)   .  No pertinent past medical history     Past Surgical History:  Procedure Laterality Date  . MYOMECTOMY  1999    Social History   Socioeconomic History  . Marital status: Divorced    Spouse name: Not on file  . Number of children: 2  . Years of education: Not on file  . Highest education level: Not on file  Occupational History  . Not on file  Social Needs  . Financial resource strain: Not on  file  . Food insecurity:    Worry: Not on file    Inability: Not on file  . Transportation needs:    Medical: Not on file    Non-medical: Not on file  Tobacco Use  . Smoking status: Never Smoker  . Smokeless tobacco: Never Used  Substance and Sexual Activity  . Alcohol use: Not Currently  . Drug use: No  . Sexual activity: Not Currently  Lifestyle  . Physical activity:    Days per week: Not on file    Minutes per session: Not on file  . Stress: Not on file  Relationships  . Social connections:    Talks on phone: Not on file    Gets together: Not on file    Attends religious service: Not on file    Active member of club or organization: Not on file    Attends meetings of clubs or organizations: Not on file    Relationship status: Not on file  . Intimate partner violence:    Fear of current or ex partner: Not on file    Emotionally abused: Not on file    Physically abused: Not on file    Forced sexual activity: Not on file  Other Topics Concern  . Not on file  Social History Narrative  . Not on file    Current Outpatient Medications on File Prior to Visit  Medication Sig Dispense Refill  . amLODipine (NORVASC) 5 MG tablet Take 1 tablet (5 mg total) by mouth daily. 90 tablet 1  . Ascorbic Acid (VITAMIN C PO) Take 1 tablet by mouth daily.    Marland Kitchen aspirin EC 81 MG tablet Take 81 mg by mouth daily.    . fish oil-omega-3 fatty acids 1000 MG capsule Take 2 g by mouth daily.     Marland Kitchen VITAMIN D PO Take 1 tablet by mouth daily.     No current facility-administered medications on file prior to visit.     Review of Systems  Constitution: Positive for malaise/fatigue. Negative for decreased appetite, weight gain and weight loss.  Eyes: Negative for visual disturbance.  Cardiovascular: Positive for chest pain, dyspnea on exertion and palpitations. Negative for claudication, leg swelling, orthopnea and syncope.  Respiratory: Negative for hemoptysis and wheezing.   Endocrine: Negative  for cold intolerance and heat intolerance.  Hematologic/Lymphatic: Does not bruise/bleed easily.  Skin: Negative for nail changes.  Musculoskeletal: Negative for muscle weakness and myalgias.  Gastrointestinal: Negative for abdominal pain, change in bowel habit, nausea and vomiting.  Neurological: Positive for dizziness. Negative for difficulty with concentration, focal weakness and headaches.  Psychiatric/Behavioral: Negative for altered mental status and suicidal ideas.  All other systems reviewed and are negative.     Objective  Blood pressure 122/75, pulse (!) 48, height 5\' 2"  (1.575 m), weight 155 lb (70.3 kg), last menstrual period 01/21/2011. Body mass index is 28.35 kg/m.    *Physical exam not performed as this is a telephone visit*    Radiology:  CXR 03/06/2018:  Mild cardiac enlargement. No evidence of active pulmonary disease.  Laboratory examination:    CMP Latest Ref Rng & Units 03/27/2018 03/05/2018 02/19/2018  Glucose 70 - 99 mg/dL 80 98 97  BUN 6 - 23 mg/dL 18 18 19   Creatinine 0.40 - 1.20 mg/dL 1.09 1.03(H) 1.18(H)  Sodium 135 - 145 mEq/L 142 143 141  Potassium 3.5 - 5.1 mEq/L 3.8 3.8 3.8  Chloride 96 - 112 mEq/L 103 103 104  CO2 19 - 32 mEq/L 31 26 28   Calcium 8.4 - 10.5 mg/dL 9.5 9.4 9.3  Total Protein 6.5 - 8.1 g/dL - - 7.1  Total Bilirubin 0.3 - 1.2 mg/dL - - 0.5  Alkaline Phos 38 - 126 U/L - - 59  AST 15 - 41 U/L - - 31  ALT 0 - 44 U/L - - 18   CBC Latest Ref Rng & Units 02/19/2018 01/24/2011 01/20/2011  WBC 4.0 - 10.5 K/uL 4.9 9.4 4.9  Hemoglobin 12.0 - 15.0 g/dL 14.5 12.3 12.2  Hematocrit 36.0 - 46.0 % 44.9 35.8(L) 37.0  Platelets 150 - 400 K/uL 237 238 235   Lipid Panel  No results found for: CHOL, TRIG, HDL, CHOLHDL, VLDL, LDLCALC, LDLDIRECT HEMOGLOBIN A1C Lab Results  Component Value Date   HGBA1C 5.3 02/21/2018   TSH No results for input(s): TSH in the last 8760 hours.  Cardiac Studies:     Assessment   Dyspnea on exertion - Plan:  PCV ECHOCARDIOGRAM COMPLETE  Atypical chest pain  Bradycardia  Essential hypertension  Palpitations  EKG 02/19/2018: Normal sinus rhythm at 74 bpm, normal axis, nonspecific T wave abnormality.   Recommendations:   Patient with hypertension, referred to [place] for evaluation of dyspnea on exertion, decreased exercise tolerance, palpitations, and bradycardia.   Patient has multiple complaints and it is difficult to differentiate her symptoms. She has chest pain at rest and not associated with exertion; however, it appears for the last 1-2 months has had changes in her exercise tolerance and newly noted bradycardia. She is currently not having chest pain and is suggestive of possible musculoskeletal etiology; however, other symptoms are concerning. She is not on beta blocker. She reports having stress test and event monitor in 2016 that she states were all normal; however, record of this was not found. States palpitations are not as bad as previously were in 2016.  In view that her symptoms somewhat improved and are now reoccurring along with newly noted decreased exercise capacity and bradycardia, she would likely benefit from re-evaluation. Hypertension appears to be well controlled. I will obtain echocardiogram for further evaluation given chest pain, dyspnea, and bradycardia. I would like to see her in the office for further evaluation and better assessment of her symptoms. I will arrange for echocardiogram to be performed soon as well as office visit afterwards to limit her exposure in the office. I have encouraged her to not push herself until she can be further evaluated and to contact us for any worsening problems.    Miquel Dunn, MSN, APRN, FNP-C [facility]. [place] Office: [phone] Fax: [phone]

## 2018-06-30 ENCOUNTER — Encounter: Payer: Self-pay | Admitting: Cardiology

## 2018-07-02 ENCOUNTER — Ambulatory Visit: Payer: 59 | Admitting: Cardiology

## 2018-07-02 ENCOUNTER — Encounter: Payer: Self-pay | Admitting: Cardiology

## 2018-07-02 ENCOUNTER — Ambulatory Visit (INDEPENDENT_AMBULATORY_CARE_PROVIDER_SITE_OTHER): Payer: 59

## 2018-07-02 ENCOUNTER — Ambulatory Visit (INDEPENDENT_AMBULATORY_CARE_PROVIDER_SITE_OTHER): Payer: 59 | Admitting: Cardiology

## 2018-07-02 ENCOUNTER — Other Ambulatory Visit: Payer: Self-pay

## 2018-07-02 VITALS — BP 146/91 | HR 57 | Ht 62.0 in | Wt 157.7 lb

## 2018-07-02 DIAGNOSIS — R0600 Dyspnea, unspecified: Secondary | ICD-10-CM

## 2018-07-02 DIAGNOSIS — R0789 Other chest pain: Secondary | ICD-10-CM | POA: Diagnosis not present

## 2018-07-02 DIAGNOSIS — R0609 Other forms of dyspnea: Secondary | ICD-10-CM | POA: Insufficient documentation

## 2018-07-02 DIAGNOSIS — I10 Essential (primary) hypertension: Secondary | ICD-10-CM

## 2018-07-02 DIAGNOSIS — R002 Palpitations: Secondary | ICD-10-CM | POA: Insufficient documentation

## 2018-07-02 DIAGNOSIS — I340 Nonrheumatic mitral (valve) insufficiency: Secondary | ICD-10-CM

## 2018-07-02 MED ORDER — TRIAMTERENE-HCTZ 37.5-25 MG PO TABS: 0.5000 | ORAL_TABLET | Freq: Every day | ORAL | 3 refills | Status: DC

## 2018-07-02 MED ORDER — AMLODIPINE BESYLATE 10 MG PO TABS: 10.0000 mg | ORAL_TABLET | Freq: Every day | ORAL | 2 refills | Status: DC

## 2018-07-02 NOTE — Progress Notes (Signed)
Primary Physician/Referring:  Shelby Mattocks, PA-C  Patient ID: Rebecca Zamora, female    DOB: [birth year], 53 y.o.   MRN: [id number]  Chief Complaint  Patient presents with  . Hypertension  . Follow-up     HPI: Rebecca Zamora  is a 53 y.o. female  with hypertension, hypoglycemia, recently evaluated by me for chest pain and cardiomegaly noted on chest x-ray. She was previously seen in 2016 by [place] and reports undergoing stress test and monitor at that time.  Patient was evaluated in the emergency room in December 2019 for dizziness.  Blood pressure was markedly elevated at that time.  Chest x-ray performed revealed mild cardiac enlargement.  She has had BNP performed that was normal.    She has noticed over the last few months decreased exercise tolerance with running and bradycardia. She has history of palpitations that are overall stable, with occasional skipped beats. She has also had occasional sharp chest pain not associated with exertion or shortness of breath.   Due to multiple complaints and as she had not had echocardiogram performed, she underwent echocardiogram today in our office and limit exposure, I had asked to see her in the office. Echo 07/02/2018 showed severe left atrial enlargement, mild RA enlargement, normal LVEF, grade 2 diastolic dysfunction, mod TR, and small pericardial effusion. Here to discuss results.  Reports walking from [facility] to [facility] last week and tolerated well, but has dyspnea with trying to run.   Originally from [place], but has lived in the [place]. for the last 25 years. Patient is very active and reports exercising with spin classes and aerobic exercises daily for several hours for the last 5-6 years. Denies any long distance running. She is unsure of any family history of heart disease or sudden cardiac death. No history of rheumatic fever.     Past Medical History:  Diagnosis Date  . Cardiomegaly   . HTN (hypertension)   . No  pertinent past medical history     Past Surgical History:  Procedure Laterality Date  . MYOMECTOMY  1999    Social History   Socioeconomic History  . Marital status: Divorced    Spouse name: Not on file  . Number of children: 2  . Years of education: Not on file  . Highest education level: Not on file  Occupational History  . Not on file  Social Needs  . Financial resource strain: Not on file  . Food insecurity:    Worry: Not on file    Inability: Not on file  . Transportation needs:    Medical: Not on file    Non-medical: Not on file  Tobacco Use  . Smoking status: Never Smoker  . Smokeless tobacco: Never Used  Substance and Sexual Activity  . Alcohol use: Not Currently  . Drug use: No  . Sexual activity: Not Currently  Lifestyle  . Physical activity:    Days per week: Not on file    Minutes per session: Not on file  . Stress: Not on file  Relationships  . Social connections:    Talks on phone: Not on file    Gets together: Not on file    Attends religious service: Not on file    Active member of club or organization: Not on file    Attends meetings of clubs or organizations: Not on file    Relationship status: Not on file  . Intimate partner violence:    Fear of current or ex  partner: Not on file    Emotionally abused: Not on file    Physically abused: Not on file    Forced sexual activity: Not on file  Other Topics Concern  . Not on file  Social History Narrative  . Not on file    Current Outpatient Medications on File Prior to Visit  Medication Sig Dispense Refill  . Ascorbic Acid (VITAMIN C PO) Take 1 tablet by mouth daily.    Marland Kitchen aspirin EC 81 MG tablet Take 81 mg by mouth daily.    . fish oil-omega-3 fatty acids 1000 MG capsule Take 2 g by mouth daily.     Marland Kitchen VITAMIN D PO Take 1 tablet by mouth daily.     No current facility-administered medications on file prior to visit.     Review of Systems  Constitution: Positive for malaise/fatigue.  Negative for decreased appetite, weight gain and weight loss.  Eyes: Negative for visual disturbance.  Cardiovascular: Positive for chest pain, dyspnea on exertion and palpitations. Negative for claudication, leg swelling, orthopnea and syncope.  Respiratory: Negative for hemoptysis and wheezing.   Endocrine: Negative for cold intolerance and heat intolerance.  Hematologic/Lymphatic: Does not bruise/bleed easily.  Skin: Negative for nail changes.  Musculoskeletal: Negative for muscle weakness and myalgias.  Gastrointestinal: Negative for abdominal pain, change in bowel habit, nausea and vomiting.  Neurological: Positive for dizziness. Negative for difficulty with concentration, focal weakness and headaches.  Psychiatric/Behavioral: Negative for altered mental status and suicidal ideas.  All other systems reviewed and are negative.     Objective  Blood pressure (!) 146/91, pulse (!) 57, height 5\' 2"  (1.575 m), weight 157 lb 11.2 oz (71.5 kg), last menstrual period 01/21/2011, SpO2 98 %. Body mass index is 28.84 kg/m.    Physical Exam  Constitutional: She is oriented to person, place, and time. Vital signs are normal. She appears well-developed and well-nourished.  HENT:  Head: Normocephalic and atraumatic.  Neck: Normal range of motion.  Cardiovascular: Normal rate, regular rhythm and intact distal pulses. Exam reveals a midsystolic click.  Murmur heard. High-pitched blowing mid to late systolic murmur is present with a grade of 2/6 at the apex. Pulmonary/Chest: Effort normal and breath sounds normal. No accessory muscle usage. No respiratory distress.  Abdominal: Soft. Bowel sounds are normal.  Musculoskeletal: Normal range of motion.  Neurological: She is alert and oriented to person, place, and time.  Skin: Skin is warm and dry.  Vitals reviewed.    Radiology:  CXR 03/06/2018: Mild cardiac enlargement. No evidence of active pulmonary disease.  Laboratory examination:     CMP Latest Ref Rng & Units 03/27/2018 03/05/2018 02/19/2018  Glucose 70 - 99 mg/dL 80 98 97  BUN 6 - 23 mg/dL 18 18 19   Creatinine 0.40 - 1.20 mg/dL 1.09 1.03(H) 1.18(H)  Sodium 135 - 145 mEq/L 142 143 141  Potassium 3.5 - 5.1 mEq/L 3.8 3.8 3.8  Chloride 96 - 112 mEq/L 103 103 104  CO2 19 - 32 mEq/L 31 26 28   Calcium 8.4 - 10.5 mg/dL 9.5 9.4 9.3  Total Protein 6.5 - 8.1 g/dL - - 7.1  Total Bilirubin 0.3 - 1.2 mg/dL - - 0.5  Alkaline Phos 38 - 126 U/L - - 59  AST 15 - 41 U/L - - 31  ALT 0 - 44 U/L - - 18   CBC Latest Ref Rng & Units 02/19/2018 01/24/2011 01/20/2011  WBC 4.0 - 10.5 K/uL 4.9 9.4 4.9  Hemoglobin 12.0 - 15.0  g/dL 14.5 12.3 12.2  Hematocrit 36.0 - 46.0 % 44.9 35.8(L) 37.0  Platelets 150 - 400 K/uL 237 238 235   Lipid Panel  No results found for: CHOL, TRIG, HDL, CHOLHDL, VLDL, LDLCALC, LDLDIRECT HEMOGLOBIN A1C Lab Results  Component Value Date   HGBA1C 5.3 02/21/2018   TSH No results for input(s): TSH in the last 8760 hours.  Cardiac Studies:   Echocardiogram 07/02/2018: Left ventricle cavity is normal in size. Normal left ventricular wall thickness. Normal global wall motion. Doppler evidence of grade II (pseudonormal) diastolic dysfunction, elevated LAP. Calculated EF 73%. Left atrial cavity is severely dilated. Right atrial cavity is slightly dilated. Trileaflet aortic valve with trace aortic valve stenosis. No aortic regurgitation. Moderate tricuspid regurgitation. Estimated pulmonary artery systolic pressure 27 mmHg. Small circumferential pericardial effusion with no significant hemodynamic effect.   Assessment   Essential hypertension - Plan: Basic Metabolic Panel (BMET)  Dyspnea on exertion  Atypical chest pain  Palpitations  Mitral late systolic murmur  EKG 79/03/4095: Normal sinus rhythm at 74 bpm, normal axis, nonspecific T wave abnormality.   Recommendations:   Patient with hypertension, referred to [place] for evaluation of dyspnea on exertion,  decreased exercise tolerance, palpitations, and bradycardia.   Has mid systolic click with late systolic mitral murmur on exam. No myxomatous degeneration. Has mod TR, but no evidence of pulmonary hypertension. I suspect that her diastolic dysfunction and left atrial enlargement is related to hypertension that continues to be uncontrolled. I also suspect that this may be causing her dyspnea on exertion and decreased exercise capacity. No clinical evidence of heart failure. I will increase her amlodipine to 10 mg daily and also add Maxzide 0.5 tablet in the morning. Will check BMP in 10 days for surveillance. If she continues to have symptoms despite better BP control, can consider stress test.  Her symptoms of chest pain are atypical. Possibly related to hypertension, GERD, or musculoskeletal etiology. Will reevaluate once her BP is better controlled.   Palpitations are suggestive of PAC/PVC's, have been present for several years and are not worse. Do not feel that she needs repeat monitoring at this time, but if symptoms worsen will reevaluate.   Patient has recently gained about 12 lbs as she states that she has been trying to eat more to help with her hypoglycemia. I have encouraged high protein diet and small, frequent meals. I suspect her weight gain may also be contributing. Encouraged her to try to lose 5-10 lbs to help with this. I will see her back in 2-3 months for follow up. Encouraged her to contact me sooner if needed for any concerns.   *I have discussed this case with Dr. Einar Gip and he personally examined the patient and participated in formulating the plan.*   Miquel Dunn, MSN, APRN, FNP-C [facility]. [place] Office: [phone] Fax: [phone]

## 2018-07-02 NOTE — Patient Instructions (Signed)
Start Maxzide 0.5 tablet daily in the morning  Increase amlodipine to 10mg  daily. Can take 2 tablets of current prescription until out then just one tablet.  Work to lose recently gained weight.  Blood work at [facility] in 2 weeks  Return in 2-3 months for follow up.   Call for any questions or concerns.

## 2018-07-03 ENCOUNTER — Ambulatory Visit: Payer: 59 | Admitting: Cardiology

## 2018-07-12 MED FILL — TRIAMTERENE/HCTZ 37.5/25 TB: 37.5-25 | 60 days supply | Qty: 30 | Fill #0

## 2018-07-12 MED FILL — AMLODIPINE BESYLATE 10 MG T: 10 | 30 days supply | Qty: 30 | Fill #0

## 2018-07-18 ENCOUNTER — Other Ambulatory Visit: Payer: Self-pay | Admitting: Cardiology

## 2018-07-18 DIAGNOSIS — I10 Essential (primary) hypertension: Secondary | ICD-10-CM | POA: Diagnosis not present

## 2018-07-19 LAB — BASIC METABOLIC PANEL
BUN/Creatinine Ratio: 15 (ref 9–23)
BUN: 20 mg/dL (ref 6–24)
CO2: 24 mmol/L (ref 20–29)
Calcium: 10 mg/dL (ref 8.7–10.2)
Chloride: 98 mmol/L (ref 96–106)
Creatinine, Ser: 1.34 mg/dL — ABNORMAL HIGH (ref 0.57–1.00)
GFR calc Af Amer: 53 mL/min/{1.73_m2} — ABNORMAL LOW (ref >59)
GFR calc non Af Amer: 46 mL/min/{1.73_m2} — ABNORMAL LOW (ref >59)
Glucose: 87 mg/dL (ref 65–99)
Potassium: 3.9 mmol/L (ref 3.5–5.2)
Sodium: 141 mmol/L (ref 134–144)

## 2018-09-19 MED FILL — AMLODIPINE BESYLATE 10 MG T: 10 | 30 days supply | Qty: 30 | Fill #0

## 2018-09-23 DIAGNOSIS — Z0000 Encounter for general adult medical examination without abnormal findings: Secondary | ICD-10-CM | POA: Diagnosis not present

## 2018-09-23 DIAGNOSIS — Z01419 Encounter for gynecological examination (general) (routine) without abnormal findings: Secondary | ICD-10-CM | POA: Diagnosis not present

## 2018-09-23 DIAGNOSIS — R61 Generalized hyperhidrosis: Secondary | ICD-10-CM | POA: Diagnosis not present

## 2018-09-23 DIAGNOSIS — Z6827 Body mass index (BMI) 27.0-27.9, adult: Secondary | ICD-10-CM | POA: Diagnosis not present

## 2018-09-23 DIAGNOSIS — Z130 Encounter for screening for diseases of the blood and blood-forming organs and certain disorders involving the immune mechanism: Secondary | ICD-10-CM | POA: Diagnosis not present

## 2018-09-23 DIAGNOSIS — Z1389 Encounter for screening for other disorder: Secondary | ICD-10-CM | POA: Diagnosis not present

## 2018-10-02 ENCOUNTER — Ambulatory Visit: Payer: 59 | Admitting: Cardiology

## 2018-10-16 MED FILL — AMLODIPINE BESYLATE 10 MG T: 10 | 30 days supply | Qty: 30 | Fill #1

## 2018-10-16 MED FILL — TRIAMTERENE/HCTZ 37.5/25 TB: 37.5-25 | 60 days supply | Qty: 30 | Fill #0

## 2018-11-13 ENCOUNTER — Other Ambulatory Visit: Payer: Self-pay | Admitting: Cardiology

## 2018-11-13 MED FILL — AMLODIPINE BESYLATE 5 MG TA: 5 | 90 days supply | Qty: 90 | Fill #0

## 2018-11-14 ENCOUNTER — Other Ambulatory Visit: Payer: Self-pay

## 2018-11-14 MED ORDER — AMLODIPINE BESYLATE 5 MG PO TABS: 5.0000 mg | ORAL_TABLET | Freq: Every day | ORAL | 0 refills | Status: DC

## 2018-11-14 MED ORDER — TRIAMTERENE-HCTZ 37.5-25 MG PO TABS: 0.5000 | ORAL_TABLET | Freq: Every day | ORAL | 0 refills | Status: DC

## 2019-01-13 ENCOUNTER — Other Ambulatory Visit: Payer: Self-pay | Admitting: Internal Medicine

## 2019-01-13 DIAGNOSIS — Z1231 Encounter for screening mammogram for malignant neoplasm of breast: Secondary | ICD-10-CM

## 2019-01-27 MED FILL — TRIAMTERENE/HCTZ 37.5/25 TB: 37.5-25 | 60 days supply | Qty: 30 | Fill #1

## 2019-02-20 ENCOUNTER — Ambulatory Visit
Admission: RE | Admit: 2019-02-20 | Discharge: 2019-02-20 | Disposition: A | Discharge status: Home / Self Care | Payer: 59 | Source: Ambulatory Visit | Attending: Internal Medicine | Admitting: Internal Medicine

## 2019-02-20 ENCOUNTER — Other Ambulatory Visit: Payer: Self-pay

## 2019-02-20 DIAGNOSIS — Z1231 Encounter for screening mammogram for malignant neoplasm of breast: Secondary | ICD-10-CM

## 2019-02-26 ENCOUNTER — Encounter: Payer: Self-pay | Admitting: Cardiology

## 2019-02-26 ENCOUNTER — Ambulatory Visit (INDEPENDENT_AMBULATORY_CARE_PROVIDER_SITE_OTHER): Payer: 59 | Admitting: Cardiology

## 2019-02-26 ENCOUNTER — Other Ambulatory Visit: Payer: Self-pay

## 2019-02-26 VITALS — BP 123/82 | HR 66 | Temp 98.0°F | Ht 62.0 in | Wt 150.2 lb

## 2019-02-26 DIAGNOSIS — I10 Essential (primary) hypertension: Secondary | ICD-10-CM

## 2019-02-26 DIAGNOSIS — R0789 Other chest pain: Secondary | ICD-10-CM

## 2019-02-26 DIAGNOSIS — R002 Palpitations: Secondary | ICD-10-CM | POA: Diagnosis not present

## 2019-02-26 NOTE — Progress Notes (Signed)
Primary Physician/Referring:  Patient, No Pcp Per  Patient ID: Rebecca Zamora, female    DOB: 1965-03-29, 54 y.o.   MRN: [id number]  Chief Complaint  Patient presents with  . Hypertension  . Heart Murmur  . Palpitations  . Follow-up    88mo    HPI: Rebecca Zamora  is a 54 y.o. female  with hypertension, hypoglycemia, last seen 6 months ago for hypertension.   Echo 07/02/2018 showed severe left atrial enlargement, mild RA enlargement, normal LVEF, grade 2 diastolic dysfunction, mod TR, and small pericardial effusion. Findings felt to be related to uncontrolled hypertension, she was started on amlodipine and maxzide.  She reports that over the last 6 months, she has felt well. Symptoms of chest pain, palpitations, dizziness, and dyspnea have improved and essentially resolved. She is feeling great. She is working on weight loss.   She generally walks for exercise, but has not been walking much lately due to weather and pandemic.   Originally from [place], but has lived in the [place]. for the last 25 years. Denies any long distance running. She is unsure of any family history of heart disease or sudden cardiac death. No history of rheumatic fever.     Past Medical History:  Diagnosis Date  . Cardiomegaly   . HTN (hypertension)   . No pertinent past medical history     Past Surgical History:  Procedure Laterality Date  . MYOMECTOMY  1999    Social History   Socioeconomic History  . Marital status: Divorced    Spouse name: Not on file  . Number of children: 2  . Years of education: Not on file  . Highest education level: Not on file  Occupational History  . Not on file  Tobacco Use  . Smoking status: Never Smoker  . Smokeless tobacco: Never Used  Substance and Sexual Activity  . Alcohol use: Not Currently  . Drug use: No  . Sexual activity: Not Currently  Other Topics Concern  . Not on file  Social History Narrative  . Not on file   Social Determinants of  Health   Financial Resource Strain:   . Difficulty of Paying Living Expenses: Not on file  Food Insecurity:   . Worried About Charity fundraiser in the Last Year: Not on file  . Ran Out of Food in the Last Year: Not on file  Transportation Needs:   . Lack of Transportation (Medical): Not on file  . Lack of Transportation (Non-Medical): Not on file  Physical Activity:   . Days of Exercise per Week: Not on file  . Minutes of Exercise per Session: Not on file  Stress:   . Feeling of Stress : Not on file  Social Connections:   . Frequency of Communication with Friends and Family: Not on file  . Frequency of Social Gatherings with Friends and Family: Not on file  . Attends Religious Services: Not on file  . Active Member of Clubs or Organizations: Not on file  . Attends Archivist Meetings: Not on file  . Marital Status: Not on file  Intimate Partner Violence:   . Fear of Current or Ex-Partner: Not on file  . Emotionally Abused: Not on file  . Physically Abused: Not on file  . Sexually Abused: Not on file    Current Outpatient Medications on File Prior to Visit  Medication Sig Dispense Refill  . amLODipine (NORVASC) 10 MG tablet Take 5 mg by mouth  daily.    . Ascorbic Acid (VITAMIN C PO) Take 1 tablet by mouth daily.    Marland Kitchen aspirin EC 81 MG tablet Take 81 mg by mouth daily.    . fish oil-omega-3 fatty acids 1000 MG capsule Take 2 g by mouth daily.     Marland Kitchen triamterene-hydrochlorothiazide (MAXZIDE-25) 37.5-25 MG tablet Take 0.5 tablets by mouth daily. 90 tablet 0  . VITAMIN D PO Take 2,000 mg by mouth daily.      No current facility-administered medications on file prior to visit.    Review of Systems  Constitution: Positive for malaise/fatigue. Negative for decreased appetite, weight gain and weight loss.  Eyes: Negative for visual disturbance.  Cardiovascular: Negative for chest pain, claudication, dyspnea on exertion, leg swelling, orthopnea, palpitations and syncope.    Respiratory: Negative for hemoptysis and wheezing.   Endocrine: Negative for cold intolerance and heat intolerance.  Hematologic/Lymphatic: Does not bruise/bleed easily.  Skin: Negative for nail changes.  Musculoskeletal: Negative for muscle weakness and myalgias.  Gastrointestinal: Negative for abdominal pain, change in bowel habit, nausea and vomiting.  Neurological: Negative for difficulty with concentration, dizziness, focal weakness and headaches.  Psychiatric/Behavioral: Negative for altered mental status and suicidal ideas.  All other systems reviewed and are negative.     Objective  Blood pressure 123/82, pulse 66, temperature 98 F (36.7 C), height 5\' 2"  (1.575 m), weight 150 lb 3.2 oz (68.1 kg), last menstrual period 01/21/2011, SpO2 99 %. Body mass index is 27.47 kg/m.    Physical Exam  Constitutional: She is oriented to person, place, and time. Vital signs are normal. She appears well-developed and well-nourished.  HENT:  Head: Normocephalic and atraumatic.  Cardiovascular: Normal rate, regular rhythm and intact distal pulses. Exam reveals a midsystolic click.  Murmur heard. High-pitched blowing mid to late systolic murmur is present with a grade of 2/6 at the apex. Pulmonary/Chest: Effort normal and breath sounds normal. No accessory muscle usage. No respiratory distress.  Abdominal: Soft. Bowel sounds are normal.  Musculoskeletal:        General: Normal range of motion.     Cervical back: Normal range of motion.  Neurological: She is alert and oriented to person, place, and time.  Skin: Skin is warm and dry.  Vitals reviewed.    Radiology:  CXR 03/06/2018: Mild cardiac enlargement. No evidence of active pulmonary disease.  Laboratory examination:    CMP Latest Ref Rng & Units 02/26/2019 07/18/2018 03/27/2018  Glucose 65 - 99 mg/dL 80 87 80  BUN 6 - 24 mg/dL 16 20 18   Creatinine 0.57 - 1.00 mg/dL 0.98 1.34(H) 1.09  Sodium 134 - 144 mmol/L 144 141 142  Potassium  3.5 - 5.2 mmol/L 4.0 3.9 3.8  Chloride 96 - 106 mmol/L 104 98 103  CO2 20 - 29 mmol/L 26 24 31   Calcium 8.7 - 10.2 mg/dL 9.5 10.0 9.5  Total Protein 6.0 - 8.5 g/dL 7.1 - -  Total Bilirubin 0.0 - 1.2 mg/dL 0.5 - -  Alkaline Phos 39 - 117 IU/L 87 - -  AST 0 - 40 IU/L 27 - -  ALT 0 - 32 IU/L 13 - -   CBC Latest Ref Rng & Units 02/19/2018 01/24/2011 01/20/2011  WBC 4.0 - 10.5 K/uL 4.9 9.4 4.9  Hemoglobin 12.0 - 15.0 g/dL 14.5 12.3 12.2  Hematocrit 36.0 - 46.0 % 44.9 35.8(L) 37.0  Platelets 150 - 400 K/uL 237 238 235   Lipid Panel  No results found for: CHOL, TRIG,  HDL, CHOLHDL, VLDL, LDLCALC, LDLDIRECT HEMOGLOBIN A1C Lab Results  Component Value Date   HGBA1C 5.3 02/21/2018   TSH No results for input(s): TSH in the last 8760 hours.  Cardiac Studies:   Echocardiogram 07/02/2018: Left ventricle cavity is normal in size. Normal left ventricular wall thickness. Normal global wall motion. Doppler evidence of grade II (pseudonormal) diastolic dysfunction, elevated LAP. Calculated EF 73%. Left atrial cavity is severely dilated. Right atrial cavity is slightly dilated. Trileaflet aortic valve with trace aortic valve stenosis. No aortic regurgitation. Moderate tricuspid regurgitation. Estimated pulmonary artery systolic pressure 27 mmHg. Small circumferential pericardial effusion with no significant hemodynamic effect.   Assessment   Essential hypertension - Plan: EKG 12-Lead, Comprehensive Metabolic Panel (CMET)  Palpitations  Atypical chest pain  EKG 02/26/2019: Sinus bradycardia at 56 bpm, normal axis, no evidence of ischemia.   Recommendations:   Patient is doing well since last seen by [place] 6 months ago. With blood pressure control, her symptoms of chest pain, dyspnea, dizziness, and palpitations have essentially resolved. Will continue with present medications. I have encouraged her to continue to work on weight loss with diet modifications and regular exercise. Will obtain CMP  for surveillance of kidney function. Will plan to see her back in 1 year for follow up on hypertension, and if she remains stable, will consider PRN follow up at that time.    Miquel Dunn, MSN, APRN, FNP-C [facility]. [place] Office: [phone] Fax: [phone]

## 2019-02-27 ENCOUNTER — Ambulatory Visit: Payer: 59 | Admitting: Cardiology

## 2019-02-27 LAB — COMPREHENSIVE METABOLIC PANEL
ALT: 13 IU/L (ref 0–32)
AST: 27 IU/L (ref 0–40)
Albumin/Globulin Ratio: 1.5 (ref 1.2–2.2)
Albumin: 4.3 g/dL (ref 3.8–4.9)
Alkaline Phosphatase: 87 IU/L (ref 39–117)
BUN/Creatinine Ratio: 16 (ref 9–23)
BUN: 16 mg/dL (ref 6–24)
Bilirubin Total: 0.5 mg/dL (ref 0.0–1.2)
CO2: 26 mmol/L (ref 20–29)
Calcium: 9.5 mg/dL (ref 8.7–10.2)
Chloride: 104 mmol/L (ref 96–106)
Creatinine, Ser: 0.98 mg/dL (ref 0.57–1.00)
GFR calc Af Amer: 77 mL/min/{1.73_m2} (ref >59)
GFR calc non Af Amer: 67 mL/min/{1.73_m2} (ref >59)
Globulin, Total: 2.8 g/dL (ref 1.5–4.5)
Glucose: 80 mg/dL (ref 65–99)
Potassium: 4 mmol/L (ref 3.5–5.2)
Sodium: 144 mmol/L (ref 134–144)
Total Protein: 7.1 g/dL (ref 6.0–8.5)

## 2019-02-27 NOTE — Progress Notes (Signed)
Patient aware of lab results.

## 2019-03-05 ENCOUNTER — Ambulatory Visit: Payer: 59

## 2019-03-05 ENCOUNTER — Ambulatory Visit: Payer: 59 | Admitting: Cardiology

## 2019-03-27 MED FILL — TRIAMTERENE/HCTZ 37.5/25 TB: 37.5-25 | 60 days supply | Qty: 30 | Fill #2

## 2019-03-28 MED FILL — LINZESS 290 MCG CAPSULE: 290 | 30 days supply | Qty: 30 | Fill #0

## 2019-05-28 MED FILL — AMLODIPINE BESYLATE 5 MG TA: 5 | 90 days supply | Qty: 90 | Fill #1

## 2019-05-28 MED FILL — TRIAMTERENE/HCTZ 37.5/25 TB: 37.5-25 | 90 days supply | Qty: 45 | Fill #0

## 2019-06-04 DIAGNOSIS — H04123 Dry eye syndrome of bilateral lacrimal glands: Secondary | ICD-10-CM | POA: Diagnosis not present

## 2019-06-04 DIAGNOSIS — H1045 Other chronic allergic conjunctivitis: Secondary | ICD-10-CM | POA: Diagnosis not present

## 2019-06-04 DIAGNOSIS — H52223 Regular astigmatism, bilateral: Secondary | ICD-10-CM | POA: Diagnosis not present

## 2019-06-04 DIAGNOSIS — Z135 Encounter for screening for eye and ear disorders: Secondary | ICD-10-CM | POA: Diagnosis not present

## 2019-06-04 DIAGNOSIS — H524 Presbyopia: Secondary | ICD-10-CM | POA: Diagnosis not present

## 2019-06-11 ENCOUNTER — Ambulatory Visit (INDEPENDENT_AMBULATORY_CARE_PROVIDER_SITE_OTHER): Payer: 59 | Admitting: Nurse Practitioner

## 2019-06-11 ENCOUNTER — Encounter: Payer: Self-pay | Admitting: Nurse Practitioner

## 2019-06-11 VITALS — BP 124/78 | HR 68 | Temp 97.4°F | Ht 62.0 in | Wt 149.2 lb

## 2019-06-11 DIAGNOSIS — R519 Headache, unspecified: Secondary | ICD-10-CM | POA: Diagnosis not present

## 2019-06-11 DIAGNOSIS — I10 Essential (primary) hypertension: Secondary | ICD-10-CM | POA: Diagnosis not present

## 2019-06-11 DIAGNOSIS — G8929 Other chronic pain: Secondary | ICD-10-CM

## 2019-06-11 DIAGNOSIS — E8881 Metabolic syndrome: Secondary | ICD-10-CM | POA: Diagnosis not present

## 2019-06-11 DIAGNOSIS — Z0000 Encounter for general adult medical examination without abnormal findings: Secondary | ICD-10-CM | POA: Diagnosis not present

## 2019-06-11 DIAGNOSIS — Z0001 Encounter for general adult medical examination with abnormal findings: Secondary | ICD-10-CM

## 2019-06-11 LAB — POCT URINALYSIS DIPSTICK
Bilirubin, UA: NEGATIVE
Blood, UA: NEGATIVE
Glucose, UA: NEGATIVE
Ketones, UA: NEGATIVE
Nitrite, UA: NEGATIVE
Protein, UA: NEGATIVE
Spec Grav, UA: 1.015 (ref 1.010–1.025)
Urobilinogen, UA: 0.2 E.U./dL
pH, UA: 6 (ref 5.0–8.0)

## 2019-06-11 LAB — POCT UA - MICROALBUMIN
Albumin/Creatinine Ratio, Urine, POC: 30
Creatinine, POC: 100 mg/dL
Microalbumin Ur, POC: 10 mg/L

## 2019-06-11 NOTE — Patient Instructions (Signed)
Health Maintenance, Female Adopting a healthy lifestyle and getting preventive care are important in promoting health and wellness. Ask your health care provider about:  The right schedule for you to have regular tests and exams.  Things you can do on your own to prevent diseases and keep yourself healthy. What should I know about diet, weight, and exercise? Eat a healthy diet   Eat a diet that includes plenty of vegetables, fruits, low-fat dairy products, and lean protein.  Do not eat a lot of foods that are high in solid fats, added sugars, or sodium. Maintain a healthy weight Body mass index (BMI) is used to identify weight problems. It estimates body fat based on height and weight. Your health care provider can help determine your BMI and help you achieve or maintain a healthy weight. Get regular exercise Get regular exercise. This is one of the most important things you can do for your health. Most adults should:  Exercise for at least 150 minutes each week. The exercise should increase your heart rate and make you sweat (moderate-intensity exercise).  Do strengthening exercises at least twice a week. This is in addition to the moderate-intensity exercise.  Spend less time sitting. Even light physical activity can be beneficial. Watch cholesterol and blood lipids Have your blood tested for lipids and cholesterol at 53 years of age, then have this test every 5 years. Have your cholesterol levels checked more often if:  Your lipid or cholesterol levels are high.  You are older than 53 years of age.  You are at high risk for heart disease. What should I know about cancer screening? Depending on your health history and family history, you may need to have cancer screening at various ages. This may include screening for:  Breast cancer.  Cervical cancer.  Colorectal cancer.  Skin cancer.  Lung cancer. What should I know about heart disease, diabetes, and high blood  pressure? Blood pressure and heart disease  High blood pressure causes heart disease and increases the risk of stroke. This is more likely to develop in people who have high blood pressure readings, are of African descent, or are overweight.  Have your blood pressure checked: ? Every 3-5 years if you are 54-9 years of age. ? Every year if you are 69 years old or older. Diabetes Have regular diabetes screenings. This checks your fasting blood sugar level. Have the screening done:  Once every three years after age 36 if you are at a normal weight and have a low risk for diabetes.  More often and at a younger age if you are overweight or have a high risk for diabetes. What should I know about preventing infection? Hepatitis B If you have a higher risk for hepatitis B, you should be screened for this virus. Talk with your health care provider to find out if you are at risk for hepatitis B infection. Hepatitis C Testing is recommended for:  Everyone born from [birth year] through 1964.  Anyone with known risk factors for hepatitis C. Sexually transmitted infections (STIs)  Get screened for STIs, including gonorrhea and chlamydia, if: ? You are sexually active and are younger than 53 years of age. ? You are older than 53 years of age and your health care provider tells you that you are at risk for this type of infection. ? Your sexual activity has changed since you were last screened, and you are at increased risk for chlamydia or gonorrhea. Ask your health care provider  if you are at risk.  Ask your health care provider about whether you are at high risk for HIV. Your health care provider may recommend a prescription medicine to help prevent HIV infection. If you choose to take medicine to prevent HIV, you should first get tested for HIV. You should then be tested every 3 months for as long as you are taking the medicine. Pregnancy  If you are about to stop having your period (premenopausal) and  you may become pregnant, seek counseling before you get pregnant.  Take 400 to 800 micrograms (mcg) of folic acid every day if you become pregnant.  Ask for birth control (contraception) if you want to prevent pregnancy. Osteoporosis and menopause Osteoporosis is a disease in which the bones lose minerals and strength with aging. This can result in bone fractures. If you are 69 years old or older, or if you are at risk for osteoporosis and fractures, ask your health care provider if you should:  Be screened for bone loss.  Take a calcium or vitamin D supplement to lower your risk of fractures.  Be given hormone replacement therapy (HRT) to treat symptoms of menopause. Follow these instructions at home: Lifestyle  Do not use any products that contain nicotine or tobacco, such as cigarettes, e-cigarettes, and chewing tobacco. If you need help quitting, ask your health care provider.  Do not use street drugs.  Do not share needles.  Ask your health care provider for help if you need support or information about quitting drugs. Alcohol use  Do not drink alcohol if: ? Your health care provider tells you not to drink. ? You are pregnant, may be pregnant, or are planning to become pregnant.  If you drink alcohol: ? Limit how much you use to 0-1 drink a day. ? Limit intake if you are breastfeeding.  Be aware of how much alcohol is in your drink. In the [place] one drink equals one 12 oz bottle of beer (355 mL), one 5 oz glass of wine (148 mL), or one 1 oz glass of hard liquor (44 mL). General instructions  Schedule regular health, dental, and eye exams.  Stay current with your vaccines.  Tell your health care provider if: ? You often feel depressed. ? You have ever been abused or do not feel safe at home. Summary  Adopting a healthy lifestyle and getting preventive care are important in promoting health and wellness.  Follow your health care provider's instructions about healthy  diet, exercising, and getting tested or screened for diseases.  Follow your health care provider's instructions on monitoring your cholesterol and blood pressure. This information is not intended to replace advice given to you by your health care provider. Make sure you discuss any questions you have with your health care provider. Document Revised: 01/30/2018 Document Reviewed: 01/30/2018 Elsevier Patient Education  2020 [facility]) Exercise Recommendation  Being physically active is important to prevent heart disease and stroke, the nation's No. 1and No. 5killers. To improve overall cardiovascular health, we suggest at least 150 minutes per week of moderate exercise or 75 minutes per week of vigorous exercise (or a combination of moderate and vigorous activity). Thirty minutes a day, five times a week is an easy goal to remember. You will also experience benefits even if you divide your time into two or three segments of 10 to 15 minutes per day.  For people who would benefit from lowering their blood pressure or cholesterol, we  recommend 40 minutes of aerobic exercise of moderate to vigorous intensity three to four times a week to lower the risk for heart attack and stroke.  Physical activity is anything that makes you move your body and burn calories.  This includes things like climbing stairs or playing sports. Aerobic exercises benefit your heart, and include walking, jogging, swimming or biking. Strength and stretching exercises are best for overall stamina and flexibility.  The simplest, positive change you can make to effectively improve your heart health is to start walking. It's enjoyable, free, easy, social and great exercise. A walking program is flexible and boasts high success rates because people can stick with it. It's easy for walking to become a regular and satisfying part of life.   For Overall Cardiovascular Health:  At least 30 minutes  of moderate-intensity aerobic activity at least 5 days per week for a total of 150  OR   At least 25 minutes of vigorous aerobic activity at least 3 days per week for a total of 75 minutes; or a combination of moderate- and vigorous-intensity aerobic activity  AND   Moderate- to high-intensity muscle-strengthening activity at least 2 days per week for additional health benefits.  For Lowering Blood Pressure and Cholesterol  An average 40 minutes of moderate- to vigorous-intensity aerobic activity 3 or 4 times per week  What if I can't make it to the time goal? Something is always better than nothing! And everyone has to start somewhere. Even if you've been sedentary for years, today is the day you can begin to make healthy changes in your life. If you don't think you'll make it for 30 or 40 minutes, set a reachable goal for today. You can work up toward your overall goal by increasing your time as you get stronger. Don't let all-or-nothing thinking rob you of doing what you can every day.  Source:http://www.heart.org    

## 2019-06-11 NOTE — Progress Notes (Signed)
Patient ID: Rebecca Zamora, female   DOB: 1965/08/07, 54 y.o.   MRN: [id number]   This visit occurred during the SARS-CoV-2 public health emergency.  Safety protocols were in place, including screening questions prior to the visit, additional usage of staff PPE, and extensive cleaning of exam room while observing appropriate contact time as indicated for disinfecting solutions.  Subjective:     Patient ID: Rebecca Zamora , female    DOB: 1965-10-03 , 54 y.o.   MRN: [id number]   Chief Complaint  Patient presents with  . Annual Exam    HPI  Here for HM  Hypertension This is a chronic problem. The current episode started more than 1 year ago. The problem is controlled. Associated symptoms include headaches. Pertinent negatives include no anxiety or blurred vision. There are no associated agents to hypertension. There are no compliance problems.  There is no history of angina. There is no history of chronic renal disease.  Headache  This is a chronic problem. The problem occurs intermittently. The pain is located in the vertex region. The quality of the pain is described as aching. Associated symptoms include dizziness (more than 1 year - 2019). Pertinent negatives include no abdominal pain, blurred vision, nausea, numbness or scalp tenderness. She has tried acetaminophen and NSAIDs for the symptoms. Her past medical history is significant for hypertension.     The patient states she uses status post hysterectomy for birth control. Last LMP was Patient's last menstrual period was 01/21/2011.. Negative for Dysmenorrhea and Negative for Menorrhagia Mammogram last done 01/2019. Negative for: breast discharge, breast lump(s), breast pain and breast self exam.  Pertinent negatives include abnormal bleeding (hematology), anxiety, decreased libido, depression, difficulty falling sleep, dyspareunia, history of infertility, nocturia, sexual dysfunction, sleep disturbances, urinary incontinence, urinary  urgency, vaginal discharge and vaginal itching. Diet regular. The patient states her exercise level is moderate - 3 days a week.    The patient's tobacco use is:  Social History   Tobacco Use  Smoking Status Never Smoker  Smokeless Tobacco Never Used   She has been exposed to passive smoke. The patient's alcohol use is:  Social History   Substance and Sexual Activity  Alcohol Use Not Currently  Additional information: hysterectomy.   Past Medical History:  Diagnosis Date  . Cardiomegaly   . HTN (hypertension)   . No pertinent past medical history      Family History  Problem Relation Age of Onset  . Hypertension Mother   . Stroke Father   . Gout Father   . Stroke Sister   . Other Neg Hx   . Diabetes Neg Hx      Current Outpatient Medications:  .  amLODipine (NORVASC) 10 MG tablet, Take 5 mg by mouth daily., Disp: , Rfl:  .  Ascorbic Acid (VITAMIN C PO), Take 1 tablet by mouth daily., Disp: , Rfl:  .  aspirin EC 81 MG tablet, Take 81 mg by mouth daily., Disp: , Rfl:  .  fish oil-omega-3 fatty acids 1000 MG capsule, Take 2 g by mouth daily. , Disp: , Rfl:  .  triamterene-hydrochlorothiazide (MAXZIDE-25) 37.5-25 MG tablet, Take 0.5 tablets by mouth daily., Disp: 90 tablet, Rfl: 0 .  VITAMIN D PO, Take 2,000 mg by mouth daily. , Disp: , Rfl:    Allergies  Allergen Reactions  . Hydrocodone Nausea Only     Review of Systems  Eyes: Negative for blurred vision.  Gastrointestinal: Negative for abdominal pain and nausea.  Neurological: Positive for dizziness (more than 1 year - 2019) and headaches. Negative for numbness.     Today's Vitals   06/11/19 1003  BP: 124/78  Pulse: 68  Temp: (!) 97.4 F (36.3 C)  TempSrc: Oral  SpO2: 96%  Weight: 149 lb 3.2 oz (67.7 kg)  Height: 5\' 2"  (1.575 m)   Body mass index is 27.29 kg/m.   Objective:  Physical Exam Constitutional:      General: She is not in acute distress.    Appearance: Normal appearance. She is  well-developed.  HENT:     Head: Normocephalic and atraumatic.     Right Ear: Hearing, tympanic membrane, ear canal and external ear normal.     Left Ear: Hearing, tympanic membrane, ear canal and external ear normal.     Nose:     Comments: Deferred - masked    Mouth/Throat:     Comments: Deferred - masked Eyes:     General: Lids are normal.     Extraocular Movements: Extraocular movements intact.     Conjunctiva/sclera: Conjunctivae normal.     Pupils: Pupils are equal, round, and reactive to light.     Funduscopic exam:    Right eye: No papilledema.        Left eye: No papilledema.  Neck:     Thyroid: No thyroid mass.     Vascular: No carotid bruit.  Cardiovascular:     Rate and Rhythm: Normal rate and regular rhythm.     Pulses: Normal pulses.     Heart sounds: Normal heart sounds. No murmur.  Pulmonary:     Effort: Pulmonary effort is normal. No respiratory distress.     Breath sounds: Normal breath sounds.  Abdominal:     General: Abdomen is flat. Bowel sounds are normal. There is no distension.     Palpations: Abdomen is soft.  Musculoskeletal:        General: No swelling. Normal range of motion.     Cervical back: Full passive range of motion without pain, normal range of motion and neck supple.     Right lower leg: No edema.     Left lower leg: No edema.  Skin:    General: Skin is warm and dry.     Capillary Refill: Capillary refill takes less than 2 seconds.  Neurological:     General: No focal deficit present.     Mental Status: She is alert and oriented to person, place, and time.     Cranial Nerves: No cranial nerve deficit.     Sensory: No sensory deficit.  Psychiatric:        Mood and Affect: Mood normal.        Behavior: Behavior normal.        Thought Content: Thought content normal.        Judgment: Judgment normal.         Assessment And Plan:     1. Health maintenance examination Behavior modifications discussed and diet history reviewed.    Pt will continue to exercise regularly and modify diet with low GI, plant based foods and decrease intake of processed foods.  Recommend intake of daily multivitamin, Vitamin D, and calcium.  Recommend mammogram (up to date) and colonoscopy (up to date) for preventive screenings, as well as recommend immunizations that include influenza, TDAP - CBC - Lipid panel  2. Essential hypertension  Chronic, excellent control  - POCT Urinalysis Dipstick (81002) - POCT UA - Microalbumin - CBC -  Comprehensive metabolic panel  3. Insulin resistance  Will check HgbA1c   Encouraged to continue with avoiding sugary foods and drinks.  Continue with regular exercise - Hemoglobin A1c  4. Chronic nonintractable headache, unspecified headache type  She is reporting persistent headache to the top of her head  Will check CT of head without contrast  She has a previous history of head trauma - CT Head Wo Contrast; Future    Minette Brine, FNP    THE PATIENT IS ENCOURAGED TO PRACTICE SOCIAL DISTANCING DUE TO THE COVID-19 PANDEMIC.

## 2019-06-12 LAB — HEMOGLOBIN A1C
Est. average glucose Bld gHb Est-mCnc: 105 mg/dL
Hgb A1c MFr Bld: 5.3 % (ref 4.8–5.6)

## 2019-06-12 LAB — COMPREHENSIVE METABOLIC PANEL
ALT: 16 IU/L (ref 0–32)
AST: 31 IU/L (ref 0–40)
Albumin/Globulin Ratio: 1.6 (ref 1.2–2.2)
Albumin: 4.4 g/dL (ref 3.8–4.9)
Alkaline Phosphatase: 85 IU/L (ref 39–117)
BUN/Creatinine Ratio: 13 (ref 9–23)
BUN: 14 mg/dL (ref 6–24)
Bilirubin Total: 0.4 mg/dL (ref 0.0–1.2)
CO2: 25 mmol/L (ref 20–29)
Calcium: 9.3 mg/dL (ref 8.7–10.2)
Chloride: 103 mmol/L (ref 96–106)
Creatinine, Ser: 1.08 mg/dL — ABNORMAL HIGH (ref 0.57–1.00)
GFR calc Af Amer: 68 mL/min/{1.73_m2} (ref >59)
GFR calc non Af Amer: 59 mL/min/{1.73_m2} — ABNORMAL LOW (ref >59)
Globulin, Total: 2.8 g/dL (ref 1.5–4.5)
Glucose: 72 mg/dL (ref 65–99)
Potassium: 4 mmol/L (ref 3.5–5.2)
Sodium: 144 mmol/L (ref 134–144)
Total Protein: 7.2 g/dL (ref 6.0–8.5)

## 2019-06-12 LAB — CBC
Hematocrit: 42.7 % (ref 34.0–46.6)
Hemoglobin: 14 g/dL (ref 11.1–15.9)
MCH: 29.2 pg (ref 26.6–33.0)
MCHC: 32.8 g/dL (ref 31.5–35.7)
MCV: 89 fL (ref 79–97)
Platelets: 251 10*3/uL (ref 150–450)
RBC: 4.8 x10E6/uL (ref 3.77–5.28)
RDW: 13 % (ref 11.7–15.4)
WBC: 4.9 10*3/uL (ref 3.4–10.8)

## 2019-06-12 LAB — LIPID PANEL
Chol/HDL Ratio: 2.2 ratio (ref 0.0–4.4)
Cholesterol, Total: 203 mg/dL — ABNORMAL HIGH (ref 100–199)
HDL: 91 mg/dL (ref >39)
LDL Chol Calc (NIH): 103 mg/dL — ABNORMAL HIGH (ref 0–99)
Triglycerides: 46 mg/dL (ref 0–149)
VLDL Cholesterol Cal: 9 mg/dL (ref 5–40)

## 2019-06-27 ENCOUNTER — Ambulatory Visit
Admission: RE | Admit: 2019-06-27 | Discharge: 2019-06-27 | Disposition: A | Discharge status: Home / Self Care | Payer: 59 | Source: Ambulatory Visit | Attending: Nurse Practitioner | Admitting: Nurse Practitioner

## 2019-06-27 DIAGNOSIS — G8929 Other chronic pain: Secondary | ICD-10-CM

## 2019-06-27 DIAGNOSIS — R519 Headache, unspecified: Secondary | ICD-10-CM

## 2019-08-07 ENCOUNTER — Other Ambulatory Visit: Payer: Self-pay | Admitting: Nurse Practitioner

## 2019-08-07 DIAGNOSIS — I10 Essential (primary) hypertension: Secondary | ICD-10-CM

## 2019-08-08 MED FILL — TRIAMTERENE-HCTZ 37.5-25 MG: 37.5-25 | 90 days supply | Qty: 45 | Fill #1

## 2019-08-08 MED FILL — AMLODIPINE BESYLATE 5 MG TA: 5 | 90 days supply | Qty: 90 | Fill #0

## 2019-09-05 MED FILL — TRIAMTERENE/HCTZ 37.5/25 CP: 37.5-25 | 30 days supply | Qty: 15 | Fill #0

## 2019-09-24 DIAGNOSIS — Z130 Encounter for screening for diseases of the blood and blood-forming organs and certain disorders involving the immune mechanism: Secondary | ICD-10-CM | POA: Diagnosis not present

## 2019-09-24 DIAGNOSIS — R7309 Other abnormal glucose: Secondary | ICD-10-CM | POA: Diagnosis not present

## 2019-09-24 DIAGNOSIS — Z6826 Body mass index (BMI) 26.0-26.9, adult: Secondary | ICD-10-CM | POA: Diagnosis not present

## 2019-09-24 DIAGNOSIS — R899 Unspecified abnormal finding in specimens from other organs, systems and tissues: Secondary | ICD-10-CM | POA: Diagnosis not present

## 2019-09-24 DIAGNOSIS — Z202 Contact with and (suspected) exposure to infections with a predominantly sexual mode of transmission: Secondary | ICD-10-CM | POA: Diagnosis not present

## 2019-09-24 DIAGNOSIS — Z01419 Encounter for gynecological examination (general) (routine) without abnormal findings: Secondary | ICD-10-CM | POA: Diagnosis not present

## 2019-09-24 DIAGNOSIS — Z1389 Encounter for screening for other disorder: Secondary | ICD-10-CM | POA: Diagnosis not present

## 2019-09-25 MED FILL — FREESTYLE LITE TEST STRIP: 25 days supply | Qty: 100 | Fill #0

## 2019-09-25 MED FILL — FREESTYLE LITE METER: 30 days supply | Qty: 1 | Fill #0

## 2019-09-25 MED FILL — FREESTYLE LANCETS: 25 days supply | Qty: 100 | Fill #0

## 2019-10-09 ENCOUNTER — Telehealth: Payer: Self-pay

## 2019-10-09 NOTE — Telephone Encounter (Signed)
Received a call from patient in regards to recurring tachycardia and hypotension. Patient states that for the past two months her blood pressure readings have been approx 80/50's and heart rate is usually in the low 40's. Heart rate increases to mid 50's with exertion. Patient is not taking her medications as prescribed. Patient has been taking medication every other due to worrisome bp and heart readings. Patient would like to know if she should be concerned. Would you like pt to schedule a follow up?

## 2019-10-09 NOTE — Telephone Encounter (Signed)
Error

## 2019-10-10 ENCOUNTER — Telehealth: Payer: Self-pay

## 2019-10-10 NOTE — Telephone Encounter (Signed)
Stop her meds and make an appointment

## 2019-10-10 NOTE — Telephone Encounter (Signed)
Should be, unless she has further issues. It does not have to be me to see her, as long as anyone in our practice can see her

## 2019-10-10 NOTE — Telephone Encounter (Signed)
Left vm to cb to schedule an appt.

## 2019-10-10 NOTE — Telephone Encounter (Signed)
The soonest patient can be seen is 9/27. Should she see another Provider sooner if possible or is this appt time okay?

## 2019-10-13 MED FILL — OMRON 3 SERIES BP MONITOR D: 30 days supply | Qty: 1 | Fill #0

## 2019-10-15 NOTE — Telephone Encounter (Signed)
Left vm to cb.

## 2019-10-17 ENCOUNTER — Encounter: Payer: Self-pay | Admitting: Cardiology

## 2019-10-17 ENCOUNTER — Ambulatory Visit: Payer: 59 | Admitting: Cardiology

## 2019-10-17 ENCOUNTER — Ambulatory Visit: Payer: 59

## 2019-10-17 ENCOUNTER — Other Ambulatory Visit: Payer: Self-pay

## 2019-10-17 VITALS — BP 132/84 | HR 51 | Resp 16 | Ht 62.0 in | Wt 144.0 lb

## 2019-10-17 DIAGNOSIS — I341 Nonrheumatic mitral (valve) prolapse: Secondary | ICD-10-CM | POA: Diagnosis not present

## 2019-10-17 DIAGNOSIS — R001 Bradycardia, unspecified: Secondary | ICD-10-CM | POA: Diagnosis not present

## 2019-10-17 DIAGNOSIS — I10 Essential (primary) hypertension: Secondary | ICD-10-CM | POA: Diagnosis not present

## 2019-10-17 DIAGNOSIS — R0609 Other forms of dyspnea: Secondary | ICD-10-CM

## 2019-10-17 DIAGNOSIS — R0600 Dyspnea, unspecified: Secondary | ICD-10-CM | POA: Diagnosis not present

## 2019-10-17 NOTE — Progress Notes (Signed)
Primary Physician/Referring:  Minette Brine, FNP  Patient ID: Rebecca Zamora, female    DOB: [birth year], 54 y.o.   MRN: [id number]  Chief Complaint  Patient presents with   Follow-up    1 year   Hypertension   Shortness of Breath    HPI: Rebecca Zamora  is a 54 y.o. female  with hypertension, hypoglycemia, Echo 07/02/2018 showed severe left atrial enlargement, mild RA enlargement, normal LVEF, grade 2 diastolic dysfunction, mod TR, and small pericardial effusion. Findings felt to be related to uncontrolled hypertension.   Originally from [place], but has lived in the [place]. for the last 25 years.  She was previously on amlodipine and hydrochlorothiazide and she had mild sinus bradycardia on examination, she had called [place] about 3 to 4 weeks ago stating that her heart rate has dipped down to 40 to 50 bpm and also having severe dizziness and very low blood pressure with low blood pressure as low as 80 of 90 mmHg systolic.  All medications have been discontinued.  She has started to feel better with regard to dizziness but states that physical activity level has decreased significantly and has been unable to perform even activities of daily living without feeling markedly fatigued and dyspneic.  Past Medical History:  Diagnosis Date   Cardiomegaly    HTN (hypertension)    No pertinent past medical history     Past Surgical History:  Procedure Laterality Date   MYOMECTOMY  1999   Social History   Tobacco Use   Smoking status: Never Smoker   Smokeless tobacco: Never Used  Substance Use Topics   Alcohol use: Not Currently   Marital Status: Divorced   Outpatient Medications Prior to Visit  Medication Sig Dispense Refill   VITAMIN D PO Take 2,000 mg by mouth daily.  (Patient not taking: Reported on 10/17/2019)     amLODipine (NORVASC) 10 MG tablet Take 5 mg by mouth daily. (Patient not taking: Reported on 10/17/2019)     amLODipine (NORVASC) 5 MG tablet TAKE 1  TABLET (5 MG TOTAL) BY MOUTH DAILY. (Patient not taking: Reported on 10/17/2019) 90 tablet 1   Ascorbic Acid (VITAMIN C PO) Take 1 tablet by mouth daily. (Patient not taking: Reported on 10/17/2019)     aspirin EC 81 MG tablet Take 81 mg by mouth daily. (Patient not taking: Reported on 10/17/2019)     fish oil-omega-3 fatty acids 1000 MG capsule Take 2 g by mouth daily.  (Patient not taking: Reported on 10/17/2019)     triamterene-hydrochlorothiazide (DYAZIDE) 37.5-25 MG capsule Take 1 capsule by mouth every other day. (Patient not taking: Reported on 10/17/2019)     No facility-administered medications prior to visit.   No orders of the defined types were placed in this encounter.  Medications Discontinued During This Encounter  Medication Reason   amLODipine (NORVASC) 10 MG tablet Discontinued by provider   amLODipine (NORVASC) 5 MG tablet Discontinued by provider   Ascorbic Acid (VITAMIN C PO) Patient Preference   aspirin EC 81 MG tablet Patient Preference   fish oil-omega-3 fatty acids 1000 MG capsule Patient Preference   triamterene-hydrochlorothiazide (DYAZIDE) 37.5-25 MG capsule Discontinued by provider      Review of Systems  Constitutional: Positive for malaise/fatigue.  Cardiovascular: Positive for dyspnea on exertion.   Objective  Blood pressure 132/84, pulse (!) 51, resp. rate 16, height 5\' 2"  (1.575 m), weight 144 lb (65.3 kg), last menstrual period 01/21/2011, SpO2 100 %. Body mass index  is 26.34 kg/m.    Physical Exam Vitals reviewed.  Constitutional:      Appearance: She is well-developed.  Cardiovascular:     Rate and Rhythm: Normal rate and regular rhythm.     Pulses: Intact distal pulses. A midsystolic click.     Heart sounds: Murmur heard. High-pitched blowing mid to late systolic murmur is present with a grade of 2/6 at the apex.   Pulmonary:     Effort: Pulmonary effort is normal. No accessory muscle usage or respiratory distress.     Breath sounds:  Normal breath sounds.  Abdominal:     General: Bowel sounds are normal.     Palpations: Abdomen is soft.  Neurological:     Mental Status: She is oriented to person, place, and time.    Radiology:  CXR 03/06/2018: Mild cardiac enlargement. No evidence of active pulmonary disease.  Laboratory examination:    CMP Latest Ref Rng & Units 06/11/2019 02/26/2019 07/18/2018  Glucose 65 - 99 mg/dL 72 80 87  BUN 6 - 24 mg/dL 14 16 20   Creatinine 0.57 - 1.00 mg/dL 1.08(H) 0.98 1.34(H)  Sodium 134 - 144 mmol/L 144 144 141  Potassium 3.5 - 5.2 mmol/L 4.0 4.0 3.9  Chloride 96 - 106 mmol/L 103 104 98  CO2 20 - 29 mmol/L 25 26 24   Calcium 8.7 - 10.2 mg/dL 9.3 9.5 10.0  Total Protein 6.0 - 8.5 g/dL 7.2 7.1 -  Total Bilirubin 0.0 - 1.2 mg/dL 0.4 0.5 -  Alkaline Phos 39 - 117 IU/L 85 87 -  AST 0 - 40 IU/L 31 27 -  ALT 0 - 32 IU/L 16 13 -   CBC Latest Ref Rng & Units 06/11/2019 02/19/2018 01/24/2011  WBC 3.4 - 10.8 x10E3/uL 4.9 4.9 9.4  Hemoglobin 11.1 - 15.9 g/dL 14.0 14.5 12.3  Hematocrit 34.0 - 46.6 % 42.7 44.9 35.8(L)  Platelets 150 - 450 x10E3/uL 251 237 238   Lipid Panel     Component Value Date/Time   CHOL 203 (H) 06/11/2019 1207   TRIG 46 06/11/2019 1207   HDL 91 06/11/2019 1207   CHOLHDL 2.2 06/11/2019 1207   LDLCALC 103 (H) 06/11/2019 1207   HEMOGLOBIN A1C Lab Results  Component Value Date   HGBA1C 5.3 06/11/2019   TSH No results for input(s): TSH in the last 8760 hours.  Cardiac Studies:   Echocardiogram 07/02/2018: Left ventricle cavity is normal in size. Normal left ventricular wall thickness. Normal global wall motion. Doppler evidence of grade II (pseudonormal) diastolic dysfunction, elevated LAP. Calculated EF 73%. Left atrial cavity is severely dilated. Right atrial cavity is slightly dilated. Trileaflet aortic valve with trace aortic valve stenosis. No aortic regurgitation. Moderate tricuspid regurgitation. Estimated pulmonary artery systolic pressure 27  mmHg. Small circumferential pericardial effusion with no significant hemodynamic effect.  EKG:  EKG 10/17/2019: Marked sinus bradycardia at the rate of 47 bpm, normal axis.  Nonspecific T-abnormality.  Compared to 02/26/2019, heart rate is further reduced from 56 bpm.   Assessment     ICD-10-CM   1. Sinus bradycardia by electrocardiogram  R00.1 LONG TERM MONITOR (3-14 DAYS)  2. Dyspnea on exertion  R06.00 LONG TERM MONITOR (3-14 DAYS)    PCV ECHOCARDIOGRAM COMPLETE  3. Essential hypertension  I10 EKG 12-Lead  4. Mitral valve prolapse  I34.1 PCV ECHOCARDIOGRAM COMPLETE     Recommendations:   Rebecca Zamora  is a 54 y.o. female  with hypertension, hypoglycemia, Echo 07/02/2018 showed severe left atrial enlargement, mild  RA enlargement, normal LVEF, grade 2 diastolic dysfunction, mod TR, and small pericardial effusion. Findings felt to be related to uncontrolled hypertension.  She has been having low BP and low HR and markedly reduced exercise tolerance and all her medications have been discontinued and she still continues to have markedly reduced exercise tolerance and continues to have marked sinus bradycardia.  Will get extended telemetry for 2 weeks and also repeat echo. 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  No changes in the medications made today. I will see her back in 4 weeks.   Adrian Prows, MD, [facility] 10/17/2019, 9:19 PM Office: [phone]

## 2019-10-29 ENCOUNTER — Ambulatory Visit: Payer: 59

## 2019-10-29 ENCOUNTER — Other Ambulatory Visit: Payer: Self-pay

## 2019-10-29 DIAGNOSIS — R0609 Other forms of dyspnea: Secondary | ICD-10-CM | POA: Diagnosis not present

## 2019-10-29 DIAGNOSIS — I341 Nonrheumatic mitral (valve) prolapse: Secondary | ICD-10-CM

## 2019-10-29 DIAGNOSIS — R0600 Dyspnea, unspecified: Secondary | ICD-10-CM

## 2019-10-31 ENCOUNTER — Other Ambulatory Visit: Payer: Self-pay

## 2019-10-31 ENCOUNTER — Ambulatory Visit: Payer: 59 | Admitting: Cardiology

## 2019-10-31 ENCOUNTER — Encounter: Payer: Self-pay | Admitting: Cardiology

## 2019-10-31 VITALS — BP 141/86 | HR 55 | Resp 15 | Ht 62.0 in | Wt 145.0 lb

## 2019-10-31 DIAGNOSIS — R0600 Dyspnea, unspecified: Secondary | ICD-10-CM

## 2019-10-31 DIAGNOSIS — R001 Bradycardia, unspecified: Secondary | ICD-10-CM

## 2019-10-31 DIAGNOSIS — R0609 Other forms of dyspnea: Secondary | ICD-10-CM | POA: Diagnosis not present

## 2019-10-31 DIAGNOSIS — I10 Essential (primary) hypertension: Secondary | ICD-10-CM

## 2019-10-31 MED ORDER — LOSARTAN POTASSIUM 50 MG PO TABS: 50.0000 mg | ORAL_TABLET | Freq: Every evening | ORAL | 2 refills | Status: DC

## 2019-10-31 MED ORDER — NIFEDIPINE ER OSMOTIC RELEASE 90 MG PO TB24: 90.0000 mg | ORAL_TABLET | ORAL | 2 refills | Status: DC

## 2019-10-31 MED FILL — NIFEdipine ER OSMOTIC RELEA: 90 | 30 days supply | Qty: 30 | Fill #0

## 2019-10-31 MED FILL — LOSARTAN POTASSIUM 50 MG TA: 50 | 30 days supply | Qty: 30 | Fill #0

## 2019-10-31 NOTE — Progress Notes (Signed)
Primary Physician/Referring:  Minette Brine, FNP  Patient ID: Rebecca Zamora, female    DOB: 24-Jan-1966, 54 y.o.   MRN: [id number]  Chief Complaint  Patient presents with  . Follow-up    2 weeks  . Bradycardia    HPI: Rebecca Zamora  is a 54 y.o. female  with hypertension, hypoglycemia, Echo 07/02/2018 showed severe left atrial enlargement, mild RA enlargement, normal LVEF, grade 2 diastolic dysfunction, mod TR, and small pericardial effusion. Findings felt to be related to uncontrolled hypertension.   The patient was previously on amlodipine and hydrochlorothiazide. She had called [place] several weeks ago stating that her heart rate has dipped down to 40 to 50 bpm and also having severe dizziness and low blood pressure 80 of 90 mmHg systolic. All medications were discontinued. A repeat echocardiogram and 2 week extended telemetry were ordered.  Since her last visit the patients is still feeling short of breath with light activity. She also continues to feel intermittently dizzy and weak. She has been checking her blood pressure and heart rate at home. Her blood pressure has remained elevated at home and she is still having heart rates in the 40s and 50s. She states she has been taking her blood pressure medication occasionally when her blood pressure is high in the past couple weeks. She continues to try to exercise but feels limited by her shortness of breath. Yesterday she rode the bike, but felt like she couldn't go as far as she typically can. Denies chest pain.  Past Medical History:  Diagnosis Date  . Cardiomegaly   . HTN (hypertension)   . No pertinent past medical history     Past Surgical History:  Procedure Laterality Date  . MYOMECTOMY  1999   Social History   Tobacco Use  . Smoking status: Never Smoker  . Smokeless tobacco: Never Used  Substance Use Topics  . Alcohol use: Not Currently   Marital Status: Divorced   Outpatient Medications Prior to Visit  Medication  Sig Dispense Refill  . ASPIRIN 81 PO Take by mouth as needed.    Marland Kitchen VITAMIN D PO Take 2,000 mg by mouth daily.     Marland Kitchen amLODipine (NORVASC) 5 MG tablet Take 5 mg by mouth as needed.     No facility-administered medications prior to visit.    Review of Systems  Constitutional: Positive for malaise/fatigue.  Cardiovascular: Positive for dyspnea on exertion. Negative for leg swelling and syncope.  Neurological: Positive for dizziness.   Objective  Blood pressure (!) 141/86, pulse (!) 55, resp. rate 15, height 5\' 2"  (1.575 m), weight 145 lb (65.8 kg), last menstrual period 01/21/2011, SpO2 100 %. Body mass index is 26.52 kg/m.  Vitals with BMI 10/31/2019 10/17/2019 06/11/2019  Height 5\' 2"  5\' 2"  5\' 2"   Weight 145 lbs 144 lbs 149 lbs 3 oz  BMI 26.51 24.40 10.27  Systolic 253 664 403  Diastolic 86 84 78  Pulse 55 51 68       Physical Exam Vitals reviewed.  Constitutional:      Appearance: She is well-developed.  Cardiovascular:     Rate and Rhythm: Regular rhythm. Bradycardia present.     Pulses: Intact distal pulses. A midsystolic click.     Heart sounds: Murmur heard. High-pitched blowing mid to late systolic murmur is present with a grade of 2/6 at the apex.   Pulmonary:     Effort: Pulmonary effort is normal. No accessory muscle usage or respiratory distress.  Breath sounds: Normal breath sounds.  Abdominal:     General: Bowel sounds are normal.     Palpations: Abdomen is soft.  Neurological:     Mental Status: She is oriented to person, place, and time.    Radiology:  CXR 03/06/2018: Mild cardiac enlargement. No evidence of active pulmonary disease.  Laboratory examination:    CMP Latest Ref Rng & Units 06/11/2019 02/26/2019 07/18/2018  Glucose 65 - 99 mg/dL 72 80 87  BUN 6 - 24 mg/dL 14 16 20   Creatinine 0.57 - 1.00 mg/dL 1.08(H) 0.98 1.34(H)  Sodium 134 - 144 mmol/L 144 144 141  Potassium 3.5 - 5.2 mmol/L 4.0 4.0 3.9  Chloride 96 - 106 mmol/L 103 104 98  CO2 20 -  29 mmol/L 25 26 24   Calcium 8.7 - 10.2 mg/dL 9.3 9.5 10.0  Total Protein 6.0 - 8.5 g/dL 7.2 7.1 -  Total Bilirubin 0.0 - 1.2 mg/dL 0.4 0.5 -  Alkaline Phos 39 - 117 IU/L 85 87 -  AST 0 - 40 IU/L 31 27 -  ALT 0 - 32 IU/L 16 13 -   CBC Latest Ref Rng & Units 06/11/2019 02/19/2018 01/24/2011  WBC 3.4 - 10.8 x10E3/uL 4.9 4.9 9.4  Hemoglobin 11.1 - 15.9 g/dL 14.0 14.5 12.3  Hematocrit 34.0 - 46.6 % 42.7 44.9 35.8(L)  Platelets 150 - 450 x10E3/uL 251 237 238   Lipid Panel     Component Value Date/Time   CHOL 203 (H) 06/11/2019 1207   TRIG 46 06/11/2019 1207   HDL 91 06/11/2019 1207   CHOLHDL 2.2 06/11/2019 1207   LDLCALC 103 (H) 06/11/2019 1207   HEMOGLOBIN A1C Lab Results  Component Value Date   HGBA1C 5.3 06/11/2019   TSH No results for input(s): TSH in the last 8760 hours.  Cardiac Studies:   Echocardiogram 10/29/2019:  Left ventricle cavity is normal in size and wall thickness. Normal global wall motion. Normal LV systolic function with EF 65%. Indeterminate diastolic filling pattern.  Mild (Grade I) mitral regurgitation.  Mild tricuspid regurgitation. Estimated pulmonary artery systolic pressure 37 mmHg.  Small circumferential pericardial effusion with no hemodynamic significance. Compared to 07/02/2018, PA pressure has increased.  Previously reported severe left atrial enlargement and mild right atrial enlargement not evident.  Suspect previously overestimation of the chamber size.  EKG:  EKG 10/17/2019: Marked sinus bradycardia at the rate of 47 bpm, normal axis.  Nonspecific T-abnormality.  Compared to 02/26/2019, heart rate is further reduced from 56 bpm.   Assessment     ICD-10-CM   1. Dyspnea on exertion  R06.00 Pulse oximetry, overnight    PCV CARDIAC STRESS TEST  2. Sinus bradycardia by electrocardiogram  R00.1   3. Essential hypertension  I10 NIFEdipine (PROCARDIA XL/NIFEDICAL-XL) 90 MG 24 hr tablet   Meds ordered this encounter  Medications  . NIFEdipine  (PROCARDIA XL/NIFEDICAL-XL) 90 MG 24 hr tablet    Sig: Take 1 tablet (90 mg total) by mouth every morning.    Dispense:  30 tablet    Refill:  2    Discontinue Amlodipine  . losartan (COZAAR) 50 MG tablet    Sig: Take 1 tablet (50 mg total) by mouth every evening.    Dispense:  30 tablet    Refill:  2   Medications Discontinued During This Encounter  Medication Reason  . amLODipine (NORVASC) 5 MG tablet Change in therapy    Recommendations:   Munirah Zennie Ayars  is a 54 y.o. female  with  hypertension, hypoglycemia, Echo 07/02/2018 showed severe left atrial enlargement, mild RA enlargement, normal LVEF, grade 2 diastolic dysfunction, mod TR, and small pericardial effusion. Findings felt to be related to uncontrolled hypertension. She had been having low BP and low HR and markedly reduced exercise tolerance and all her medications were discontinued. She continues to have markedly reduced exercise tolerance and continues to have marked sinus bradycardia.   Since her last visit she has completed extended telemetry for two weeks and echocardiogram. We have not received the results of the telemetry yet. We will call the patient as soon as the study results come in. The results of the echocardiogram 10/29/2019 were discussed with the patient. It showed normal LVEF, mild mitral regurgitation, mild tricuspid regurgitation, estimated pulmonary artery systolic pressure 37 mmHg.  Her blood pressure was elevated today in clinic and has been elevated at home. We will discontinue Amlodipine start procardia XL 90 mg daily for blood pressure control which may also improve her heart rate. Will also start losartan 50 mg each night. She is encouraged to continue monitoring her blood pressure at home.  Will obtain treadmill stress test and nocturnal oximetry study. She will follow up back in clinic in 4 weeks.  I am beginning to suspect she may have primary pulmonary hypertension.  The above test will certainly help  [place] in making further decisions.  I have reassured her with regard to low heart rate.  Blair Heys, PA Student 11/01/19 8:46 AM

## 2019-11-03 DIAGNOSIS — R001 Bradycardia, unspecified: Secondary | ICD-10-CM | POA: Diagnosis not present

## 2019-11-05 DIAGNOSIS — R001 Bradycardia, unspecified: Secondary | ICD-10-CM | POA: Diagnosis not present

## 2019-11-12 ENCOUNTER — Other Ambulatory Visit: Payer: Self-pay

## 2019-11-12 ENCOUNTER — Ambulatory Visit: Payer: 59

## 2019-11-12 DIAGNOSIS — R0600 Dyspnea, unspecified: Secondary | ICD-10-CM

## 2019-11-12 DIAGNOSIS — R0609 Other forms of dyspnea: Secondary | ICD-10-CM

## 2019-11-13 ENCOUNTER — Encounter: Payer: Self-pay | Admitting: Cardiology

## 2019-11-17 ENCOUNTER — Ambulatory Visit: Payer: 59 | Admitting: Cardiology

## 2019-12-02 ENCOUNTER — Encounter: Payer: Self-pay | Admitting: Nurse Practitioner

## 2019-12-02 ENCOUNTER — Ambulatory Visit: Payer: 59 | Admitting: Nurse Practitioner

## 2019-12-02 ENCOUNTER — Other Ambulatory Visit: Payer: Self-pay | Admitting: Nurse Practitioner

## 2019-12-02 ENCOUNTER — Other Ambulatory Visit: Payer: Self-pay

## 2019-12-02 VITALS — BP 112/80 | HR 60 | Temp 98.1°F | Ht 62.0 in | Wt 146.6 lb

## 2019-12-02 DIAGNOSIS — J302 Other seasonal allergic rhinitis: Secondary | ICD-10-CM

## 2019-12-02 DIAGNOSIS — H579 Unspecified disorder of eye and adnexa: Secondary | ICD-10-CM

## 2019-12-02 DIAGNOSIS — R5382 Chronic fatigue, unspecified: Secondary | ICD-10-CM | POA: Diagnosis not present

## 2019-12-02 DIAGNOSIS — M542 Cervicalgia: Secondary | ICD-10-CM

## 2019-12-02 DIAGNOSIS — M62838 Other muscle spasm: Secondary | ICD-10-CM

## 2019-12-02 DIAGNOSIS — N289 Disorder of kidney and ureter, unspecified: Secondary | ICD-10-CM

## 2019-12-02 MED ORDER — AZITHROMYCIN 250 MG PO TABS: ORAL_TABLET | ORAL | 0 refills | Status: DC

## 2019-12-02 MED ORDER — CYCLOBENZAPRINE HCL 10 MG PO TABS: 10.0000 mg | ORAL_TABLET | Freq: Three times a day (TID) | ORAL | 0 refills | Status: DC | PRN

## 2019-12-02 MED ORDER — PREDNISONE 10 MG (21) PO TBPK: ORAL_TABLET | ORAL | 0 refills | Status: DC

## 2019-12-02 MED ORDER — MOMETASONE FUROATE 50 MCG/ACT NA SUSP: 2.0000 | Freq: Every day | NASAL | 2 refills | Status: DC

## 2019-12-02 MED FILL — MOMETASONE FUROATE 50 MCG S: 50 | 60 days supply | Qty: 17 | Fill #0

## 2019-12-02 MED FILL — CYCLOBENZAPRINE HCL 10 MG T: 10 | 10 days supply | Qty: 30 | Fill #0

## 2019-12-02 MED FILL — AZITHROMYCIN 250 MG TABLET: 250 | 5 days supply | Qty: 6 | Fill #0

## 2019-12-02 MED FILL — predniSONE 10 MG TABS: 10 | 6 days supply | Qty: 21 | Fill #0

## 2019-12-02 NOTE — Progress Notes (Signed)
I,Yamilka Roman [employer] as a Education administrator for [employer], FNP.,have documented all relevant documentation on the behalf of Minette Brine, FNP,as directed by  Minette Brine, FNP while in the presence of Minette Brine, [place]. This visit occurred during the SARS-CoV-2 public health emergency.  Safety protocols were in place, including screening questions prior to the visit, additional usage of staff PPE, and extensive cleaning of exam room while observing appropriate contact time as indicated for disinfecting solutions.  Subjective:     Patient ID: Rebecca Zamora , female    DOB: 10-Sep-1965 , 54 y.o.   MRN: [id number]   Chief Complaint  Patient presents with  . Neck Pain    patient stated her neck started hurting last thrusday. she thought maybe she slept wrong and the pain radiates down her arm.  . headache    patient stated she has been having a headache and her eyes feels like her eye is throbbing.     HPI  She has been seeing a cardiologist for her low heart rate.    She is having pain to her right eye for the last 2 weeks.  She is often having allergy symptoms.   Neck Pain  This is a new problem. The current episode started in the past 7 days. Associated symptoms include headaches. Pertinent negatives include no chest pain.     Past Medical History:  Diagnosis Date  . Cardiomegaly   . HTN (hypertension)   . No pertinent past medical history      Family History  Problem Relation Age of Onset  . Hypertension Mother   . Stroke Father   . Gout Father   . Stroke Sister   . Other Neg Hx   . Diabetes Neg Hx      Current Outpatient Medications:  .  ASPIRIN 81 PO, Take by mouth as needed., Disp: , Rfl:  .  NIFEdipine (PROCARDIA XL/NIFEDICAL-XL) 90 MG 24 hr tablet, Take 1 tablet (90 mg total) by mouth every morning., Disp: 30 tablet, Rfl: 2 .  VITAMIN D PO, Take 2,000 mg by mouth daily. , Disp: , Rfl:  .  azithromycin (ZITHROMAX) 250 MG tablet, Take 2 tablets (500 mg) on  Day  1,  followed by 1 tablet (250 mg) once daily on Days 2 through 5., Disp: 6 each, Rfl: 0 .  cyclobenzaprine (FLEXERIL) 10 MG tablet, Take 1 tablet (10 mg total) by mouth 3 (three) times daily as needed for muscle spasms., Disp: 30 tablet, Rfl: 0 .  losartan (COZAAR) 50 MG tablet, Take 1 tablet (50 mg total) by mouth every evening. (Patient not taking: Reported on 12/02/2019), Disp: 30 tablet, Rfl: 2 .  mometasone (NASONEX) 50 MCG/ACT nasal spray, Place 2 sprays into the nose daily., Disp: 1 each, Rfl: 2 .  predniSONE (STERAPRED UNI-PAK 21 TAB) 10 MG (21) TBPK tablet, Take as directed, Disp: 21 tablet, Rfl: 0   Allergies  Allergen Reactions  . Hydrocodone Nausea Only     Review of Systems  Constitutional: Negative.   Respiratory: Negative.   Cardiovascular: Negative.  Negative for chest pain, palpitations and leg swelling.  Neurological: Positive for headaches.  Psychiatric/Behavioral: Negative.      Today's Vitals   12/02/19 1432  BP: 112/80  Pulse: 60  Temp: 98.1 F (36.7 C)  TempSrc: Oral  Weight: 146 lb 9.6 oz (66.5 kg)  Height: _0  (1.575 m)  PainSc: 5    Body mass index is 26.81 kg/m.   Objective:  Physical Exam Vitals reviewed.  Constitutional:      General: She is not in acute distress.    Appearance: Normal appearance. She is well-developed.  HENT:     Head: Atraumatic.     Right Ear: Hearing normal. There is no impacted cerumen.     Left Ear: Hearing normal. There is no impacted cerumen.  Eyes:     General: Lids are normal.     Conjunctiva/sclera: Conjunctivae normal.     Pupils: Pupils are equal, round, and reactive to light.     Funduscopic exam:    Right eye: No papilledema.        Left eye: No papilledema.  Neck:     Thyroid: No thyroid mass.     Vascular: No carotid bruit.     Comments: Neck tension bilaterally Cardiovascular:     Rate and Rhythm: Normal rate and regular rhythm.     Pulses: Normal pulses.     Heart sounds: Normal heart sounds.  No murmur heard.   Pulmonary:     Effort: Pulmonary effort is normal.     Breath sounds: Normal breath sounds.  Chest:     Breasts: Breasts are symmetrical.        Right: Normal. No mass or nipple discharge.        Left: Normal. No mass or nipple discharge.  Genitourinary:    General: Normal vulva.     Pubic Area: No rash.      Labia:        Right: No tenderness.        Left: No tenderness.      Vagina: Normal.     Cervix: Normal.     Uterus: Normal.      Adnexa: Right adnexa normal and left adnexa normal.  Musculoskeletal:        General: No swelling.     Cervical back: Full passive range of motion without pain, normal range of motion and neck supple.     Right lower leg: No edema.     Left lower leg: No edema.  Lymphadenopathy:     Upper Body:     Right upper body: No supraclavicular or axillary adenopathy.     Left upper body: No supraclavicular or axillary adenopathy.  Skin:    General: Skin is warm and dry.     Capillary Refill: Capillary refill takes less than 2 seconds.  Neurological:     General: No focal deficit present.     Mental Status: She is alert and oriented to person, place, and time.     Cranial Nerves: No cranial nerve deficit.     Sensory: No sensory deficit.  Psychiatric:        Mood and Affect: Mood normal.        Behavior: Behavior normal.        Thought Content: Thought content normal.        Judgment: Judgment normal.         Assessment And Plan:     1. Chronic fatigue Will check for metabolic causes - Vitamin B12 - TSH - CMP14+EGFR - CBC  2. Neck pain She has tenderness to posterior neck with decreased range of motion - predniSONE (STERAPRED UNI-PAK 21 TAB) 10 MG (21) TBPK tablet; Take as directed  Dispense: 21 tablet; Refill: 0  3. Eye pressure  May be related to headache vs sinusitis, will treat with azithromycin - azithromycin (ZITHROMAX) 250 MG tablet; Take 2 tablets (500 mg) on  Day 1,  followed by 1 tablet (250 mg) once  daily on Days 2 through 5.  Dispense: 6 each; Refill: 0  4. Seasonal allergies  This has been persistent will treat with nasal spray and steroid taper - mometasone (NASONEX) 50 MCG/ACT nasal spray; Place 2 sprays into the nose daily.  Dispense: 1 each; Refill: 2 - predniSONE (STERAPRED UNI-PAK 21 TAB) 10 MG (21) TBPK tablet; Take as directed  Dispense: 21 tablet; Refill: 0 - azithromycin (ZITHROMAX) 250 MG tablet; Take 2 tablets (500 mg) on  Day 1,  followed by 1 tablet (250 mg) once daily on Days 2 through 5.  Dispense: 6 each; Refill: 0  5. Muscle spasm - cyclobenzaprine (FLEXERIL) 10 MG tablet; Take 1 tablet (10 mg total) by mouth 3 (three) times daily as needed for muscle spasms.  Dispense: 30 tablet; Refill: 0   6. Decreased renal function  She has had decreased GFR the last 2 blood draws and she is concerned about this and would like a referral to nephrology, she has controlled hypertension.    Patient was given opportunity to ask questions. Patient verbalized understanding of the plan and was able to repeat key elements of the plan. All questions were answered to their satisfaction.   Teola Bradley, FNP, have reviewed all documentation for this visit. The documentation on 12/02/19 for the exam, diagnosis, procedures, and orders are all accurate and complete.  THE PATIENT IS ENCOURAGED TO PRACTICE SOCIAL DISTANCING DUE TO THE COVID-19 PANDEMIC.

## 2019-12-03 LAB — CBC
Hematocrit: 41.4 % (ref 34.0–46.6)
Hemoglobin: 13.8 g/dL (ref 11.1–15.9)
MCH: 29.3 pg (ref 26.6–33.0)
MCHC: 33.3 g/dL (ref 31.5–35.7)
MCV: 88 fL (ref 79–97)
Platelets: 218 10*3/uL (ref 150–450)
RBC: 4.71 x10E6/uL (ref 3.77–5.28)
RDW: 13.8 % (ref 11.7–15.4)
WBC: 4.1 10*3/uL (ref 3.4–10.8)

## 2019-12-03 LAB — CMP14+EGFR
ALT: 18 IU/L (ref 0–32)
AST: 26 IU/L (ref 0–40)
Albumin/Globulin Ratio: 1.4 (ref 1.2–2.2)
Albumin: 4.2 g/dL (ref 3.8–4.9)
Alkaline Phosphatase: 92 IU/L (ref 44–121)
BUN/Creatinine Ratio: 12 (ref 9–23)
BUN: 14 mg/dL (ref 6–24)
Bilirubin Total: 0.5 mg/dL (ref 0.0–1.2)
CO2: 25 mmol/L (ref 20–29)
Calcium: 9.6 mg/dL (ref 8.7–10.2)
Chloride: 99 mmol/L (ref 96–106)
Creatinine, Ser: 1.17 mg/dL — ABNORMAL HIGH (ref 0.57–1.00)
GFR calc Af Amer: 61 mL/min/{1.73_m2} (ref >59)
GFR calc non Af Amer: 53 mL/min/{1.73_m2} — ABNORMAL LOW (ref >59)
Globulin, Total: 2.9 g/dL (ref 1.5–4.5)
Glucose: 71 mg/dL (ref 65–99)
Potassium: 3.4 mmol/L — ABNORMAL LOW (ref 3.5–5.2)
Sodium: 143 mmol/L (ref 134–144)
Total Protein: 7.1 g/dL (ref 6.0–8.5)

## 2019-12-03 LAB — VITAMIN B12: Vitamin B-12: 941 pg/mL (ref 232–1245)

## 2019-12-03 LAB — TSH: TSH: 1.12 u[IU]/mL (ref 0.450–4.500)

## 2019-12-10 ENCOUNTER — Ambulatory Visit: Payer: 59 | Admitting: Cardiology

## 2019-12-11 ENCOUNTER — Other Ambulatory Visit: Payer: Self-pay

## 2019-12-11 ENCOUNTER — Encounter: Payer: Self-pay | Admitting: Cardiology

## 2019-12-11 ENCOUNTER — Ambulatory Visit: Payer: 59 | Admitting: Nurse Practitioner

## 2019-12-11 ENCOUNTER — Ambulatory Visit: Payer: 59 | Admitting: Cardiology

## 2019-12-11 VITALS — BP 127/81 | HR 65 | Resp 16 | Ht 62.0 in | Wt 144.0 lb

## 2019-12-11 DIAGNOSIS — I10 Essential (primary) hypertension: Secondary | ICD-10-CM | POA: Diagnosis not present

## 2019-12-11 DIAGNOSIS — R55 Syncope and collapse: Secondary | ICD-10-CM | POA: Diagnosis not present

## 2019-12-11 DIAGNOSIS — R0600 Dyspnea, unspecified: Secondary | ICD-10-CM

## 2019-12-11 DIAGNOSIS — N289 Disorder of kidney and ureter, unspecified: Secondary | ICD-10-CM

## 2019-12-11 DIAGNOSIS — R001 Bradycardia, unspecified: Secondary | ICD-10-CM | POA: Diagnosis not present

## 2019-12-11 DIAGNOSIS — R0609 Other forms of dyspnea: Secondary | ICD-10-CM

## 2019-12-11 NOTE — Progress Notes (Signed)
Primary Physician/Referring:  Minette Brine, FNP  Patient ID: Rebecca Zamora, female    DOB: 1965/12/09, 54 y.o.   MRN: [id number]  Chief Complaint  Patient presents with  . Follow-up    4 week  . Hypertension  . Post gxt    HPI: Rebecca Zamora  is a 54 y.o. female  with hypertension, hypoglycemia, recurrent episodes of palpitations, near syncope, marked worsening dyspnea and fatigue who presents for office visit follow-up at 6 weeks. She underwent repeat echocardiogram and treadmill stress test and presents for follow-up.  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  She has now taken to half-time work and gives me more history.  States that she feels near syncopal spells when she has urgency especially when she feels defecation reflux coming on but she is able to hold on.  She has most difficulty when she has urinary urge and she is unable to hold her arch and hence when she suddenly gets up to go to the bathroom, she does feel near syncopal as well.  She also states that she has had frequency of urination especially in the evening and frequently gets up at least every 30 minutes throughout the night.  She also complains of marked dyspnea and states that she has no energy and feels marked fatigue even doing routine chores.  States that it all started recently over the past year or so and has been gradually worsening.    Past Medical History:  Diagnosis Date  . Cardiomegaly   . HTN (hypertension)   . No pertinent past medical history     Past Surgical History:  Procedure Laterality Date  . MYOMECTOMY  1999   Social History   Tobacco Use  . Smoking status: Never Smoker  . Smokeless tobacco: Never Used  Substance Use Topics  . Alcohol use: Not Currently   Marital Status: Divorced   Outpatient Medications Prior to Visit  Medication Sig  Dispense Refill  . amLODipine (NORVASC) 5 MG tablet Take 5 mg by mouth daily.    . ASPIRIN 81 PO Take by mouth as needed.    . cyclobenzaprine (FLEXERIL) 10 MG tablet Take 1 tablet (10 mg total) by mouth 3 (three) times daily as needed for muscle spasms. 30 tablet 0  . mometasone (NASONEX) 50 MCG/ACT nasal spray Place 2 sprays into the nose daily. 1 each 2  . predniSONE (STERAPRED UNI-PAK 21 TAB) 10 MG (21) TBPK tablet Take as directed 21 tablet 0  . VITAMIN D PO Take 2,000 mg by mouth daily.     Marland Kitchen losartan (COZAAR) 50 MG tablet Take 1 tablet (50 mg total) by mouth every evening. (Patient not taking: Reported on 12/02/2019) 30 tablet 2  . NIFEdipine (PROCARDIA XL/NIFEDICAL-XL) 90 MG 24 hr tablet Take 1 tablet (90 mg total) by mouth every morning. (Patient not taking: Reported on 12/11/2019) 30 tablet 2   No facility-administered medications prior to visit.    Review of Systems  Constitutional: Positive for malaise/fatigue.  Cardiovascular: Positive for dyspnea on exertion. Negative for leg swelling and syncope.  Neurological: Positive for dizziness.   Objective  Blood pressure 127/81, pulse 65, resp. rate 16, height 5\' 2"  (1.575 m), weight 144 lb (65.3 kg), last menstrual period 01/21/2011, SpO2 100 %. Body mass index is 26.34 kg/m.  Vitals with BMI 12/11/2019 12/02/2019 10/31/2019  Height 5\' 2"  5\' 2"  5\' 2"   Weight 144 lbs 146 lbs 10 oz 145 lbs  BMI 26.33 92.42 68.34  Systolic 196 222 979  Diastolic 81 80 86  Pulse 65 60 55       Physical Exam Vitals reviewed.  Constitutional:      Appearance: She is well-developed.  Cardiovascular:     Rate and Rhythm: Regular rhythm. Bradycardia present.     Pulses: Intact distal pulses. A midsystolic click.     Heart sounds: Murmur heard. High-pitched blowing mid to late systolic murmur is present with a grade of 2/6 at the apex.   Pulmonary:     Effort: Pulmonary effort is normal. No accessory muscle usage or respiratory distress.      Breath sounds: Normal breath sounds.  Abdominal:     General: Bowel sounds are normal.     Palpations: Abdomen is soft.  Neurological:     Mental Status: She is oriented to person, place, and time.    Radiology:  CXR 03/06/2018: Mild cardiac enlargement. No evidence of active pulmonary disease.  Laboratory examination:    CMP Latest Ref Rng & Units 12/02/2019 06/11/2019 02/26/2019  Glucose 65 - 99 mg/dL 71 72 80  BUN 6 - 24 mg/dL 14 14 16   Creatinine 0.57 - 1.00 mg/dL 1.17(H) 1.08(H) 0.98  Sodium 134 - 144 mmol/L 143 144 144  Potassium 3.5 - 5.2 mmol/L 3.4(L) 4.0 4.0  Chloride 96 - 106 mmol/L 99 103 104  CO2 20 - 29 mmol/L 25 25 26   Calcium 8.7 - 10.2 mg/dL 9.6 9.3 9.5  Total Protein 6.0 - 8.5 g/dL 7.1 7.2 7.1  Total Bilirubin 0.0 - 1.2 mg/dL 0.5 0.4 0.5  Alkaline Phos 44 - 121 IU/L 92 85 87  AST 0 - 40 IU/L 26 31 27   ALT 0 - 32 IU/L 18 16 13    CBC Latest Ref Rng & Units 12/02/2019 06/11/2019 02/19/2018  WBC 3.4 - 10.8 x10E3/uL 4.1 4.9 4.9  Hemoglobin 11.1 - 15.9 g/dL 13.8 14.0 14.5  Hematocrit 34.0 - 46.6 % 41.4 42.7 44.9  Platelets 150 - 450 x10E3/uL 218 251 237   Lipid Panel     Component Value Date/Time   CHOL 203 (H) 06/11/2019 1207   TRIG 46 06/11/2019 1207   HDL 91 06/11/2019 1207   CHOLHDL 2.2 06/11/2019 1207   LDLCALC 103 (H) 06/11/2019 1207   HEMOGLOBIN A1C Lab Results  Component Value Date   HGBA1C 5.3 06/11/2019   TSH Recent Labs    12/02/19 1547  TSH 1.120    Cardiac Studies:   Echocardiogram 10/29/2019:  Left ventricle cavity is normal in size and wall thickness. Normal global wall motion. Normal LV systolic function with EF 65%. Indeterminate diastolic filling pattern.  Mild (Grade I) mitral regurgitation.  Mild tricuspid regurgitation. Estimated pulmonary artery systolic pressure 37 mmHg.  Small circumferential pericardial effusion with no hemodynamic significance. Compared to 07/02/2018, PA pressure has increased.  Previously reported severe  left atrial enlargement and mild right atrial enlargement not evident.  Suspect previously overestimation of the chamber size.  Exercise treadmill stress test 11/12/2019: Exercise treadmill stress test performed using Bruce protocol.  Patient reached 7.0 METS, and 75% of age predicted maximum heart rate.  Exercise capacity was low.  No chest pain reported.  Exertional dyspnea and dizziness reported. Normal heart rate response. Relatively flat blood pressure response. Peak exercise EKG showed 1-1.mm upsloping ST depression in lead II, normalized 2 min into recovery.  Sub-maximal exercise treadmill stress test with equivocal EKG changes. Consider further ischemic workup, as clinically indicated.   Zio Patch Extended out patient EKG monitoring 7 days 10/17/2019:  Predominant rhythm is normal sinus rhythm. Minimum heart rate 37, maximum heart rate 150 bpm with average heart rate of 62 bpm. Rare supraventricular ectopy. Rare ventricular ectopy and ventricular couplets. No symptoms reported. No heart block.  EKG:   EKG 10/17/2019: Marked sinus bradycardia at the rate of 47 bpm, normal axis.  Nonspecific T-abnormality.  Compared to 02/26/2019, heart rate is further reduced from 56 bpm.   Assessment     ICD-10-CM   1. Dyspnea on exertion  R06.00 Ambulatory referral to Pulmonology  2. Situational syncope - Defecation near syncope  R55   3. Sinus bradycardia by electrocardiogram  R00.1   4. Essential hypertension  I10    No orders of the defined types were placed in this encounter.  Medications Discontinued During This Encounter  Medication Reason  . losartan (COZAAR) 50 MG tablet Patient Preference  . NIFEdipine (PROCARDIA XL/NIFEDICAL-XL) 90 MG 24 hr tablet Side effect (s)    Recommendations:   Rebecca Zamora  is a 54 y.o. female  with hypertension, hypoglycemia, prior uncontrolled hypertension. She had been having low BP and low HR and markedly reduced exercise tolerance, episodes of near  syncope and dizziness, generalized fatigue, who presents here for follow-up of marked dyspnea, dizziness and fatigue.  I had seen her 6 weeks ago.  On further questioning today she states that her episodes of near syncope are clearly related to defecation symptoms, also occasionally associated with urinary urgency.  She has situational near syncope.  I have discussed with her regarding counterpressure maneuver and fall precautions.  With regard to sinus bradycardia, she has had normal heart rate response during outpatient extended EKG monitoring.  Hence do not suspect sinus node dysfunction.  With regard to her dyspnea on exertion marked reduced exercise tolerance, although the stress test was equivocal, do not suspect underlying coronary artery disease.  She has excellent lipids, except for hypertension no other significant cardiovascular risk factors.  I am beginning to wonder if there is any pulmonary etiology for her dyspnea and reduced exercise tolerance.  I would like to make a referral for pulmonary medicine and only if the work-up there is negative, I would consider coronary CTA.  Small circumferential pericardial effusion is of no clinical consequence.  She has no cardiomegaly neither does she have any cardiomyopathy.  She is also having frequency of urination especially at night and states that she has to go to the bathroom almost every 20 to 30 minutes.  Does not happen during the daytime.  I am not sure about her sequence of symptoms.  She will discuss with her PCP and may need urologic evaluation.  I would like to see her back in 3 months for follow-up.  I have again reviewed all her medical issues, echocardiogram, stress test and also event monitor with the patient.  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  I will wait for pulmonary medicine to comment prior  to releasing her back to full unrestricted activity/work.  I again spent 40 minutes with the patient with multiple and complex symptomatology, complex clinical presentation and for coordination of care.   Adrian Prows, MD, [facility] 12/12/2019, 2:50 PM Office: [phone] Pager: [phone]

## 2019-12-22 DIAGNOSIS — Z9114 Patient's other noncompliance with medication regimen: Secondary | ICD-10-CM | POA: Diagnosis not present

## 2019-12-22 DIAGNOSIS — N1831 Chronic kidney disease, stage 3a: Secondary | ICD-10-CM | POA: Diagnosis not present

## 2019-12-22 DIAGNOSIS — I129 Hypertensive chronic kidney disease with stage 1 through stage 4 chronic kidney disease, or unspecified chronic kidney disease: Secondary | ICD-10-CM | POA: Diagnosis not present

## 2019-12-22 DIAGNOSIS — E876 Hypokalemia: Secondary | ICD-10-CM | POA: Diagnosis not present

## 2019-12-23 ENCOUNTER — Other Ambulatory Visit: Payer: Self-pay

## 2019-12-23 MED ORDER — AMLODIPINE BESYLATE 5 MG PO TABS
5.0000 mg | ORAL_TABLET | Freq: Every day | ORAL | 0 refills | Status: DC
Start: 2019-12-23 — End: 2020-03-24

## 2019-12-23 MED FILL — AMLODIPINE BESYLATE 5 MG TA: 5 | 90 days supply | Qty: 90 | Fill #0

## 2019-12-26 ENCOUNTER — Other Ambulatory Visit: Payer: Self-pay | Admitting: Nephrology

## 2019-12-26 DIAGNOSIS — N1831 Chronic kidney disease, stage 3a: Secondary | ICD-10-CM

## 2020-01-05 ENCOUNTER — Ambulatory Visit: Payer: 59 | Admitting: Nurse Practitioner

## 2020-01-06 ENCOUNTER — Ambulatory Visit
Admission: RE | Admit: 2020-01-06 | Discharge: 2020-01-06 | Disposition: A | Discharge status: Home / Self Care | Payer: 59 | Source: Ambulatory Visit | Attending: Nephrology | Admitting: Nephrology

## 2020-01-06 DIAGNOSIS — N1831 Chronic kidney disease, stage 3a: Secondary | ICD-10-CM

## 2020-01-06 DIAGNOSIS — N2889 Other specified disorders of kidney and ureter: Secondary | ICD-10-CM | POA: Diagnosis not present

## 2020-01-08 ENCOUNTER — Other Ambulatory Visit: Payer: Self-pay | Admitting: Nurse Practitioner

## 2020-01-08 DIAGNOSIS — Z1231 Encounter for screening mammogram for malignant neoplasm of breast: Secondary | ICD-10-CM

## 2020-01-20 MED FILL — AMLODIPINE 2.5 MG TABLET: 2.5 | 30 days supply | Qty: 30 | Fill #0

## 2020-02-09 ENCOUNTER — Institutional Professional Consult (permissible substitution): Payer: 59 | Admitting: Pulmonary Disease

## 2020-02-24 ENCOUNTER — Ambulatory Visit: Payer: 59

## 2020-02-26 ENCOUNTER — Ambulatory Visit: Payer: 59 | Admitting: Cardiology

## 2020-03-08 ENCOUNTER — Institutional Professional Consult (permissible substitution): Payer: 59 | Admitting: Pulmonary Disease

## 2020-03-10 ENCOUNTER — Telehealth: Payer: Self-pay

## 2020-03-10 NOTE — Telephone Encounter (Signed)
Patient called stating that she needs a note for work stating that she cannot wear a N95 mask and that you have done one for her in the past, when she is not able to get in with her pulmonologist. Please advise.

## 2020-03-10 NOTE — Telephone Encounter (Signed)
Please bring her in and I would like to see her again prior to giving the letter. Do a 6 min walk test with N 95 mask

## 2020-03-12 ENCOUNTER — Ambulatory Visit: Payer: 59 | Admitting: Cardiology

## 2020-03-23 ENCOUNTER — Other Ambulatory Visit: Payer: Self-pay | Admitting: Cardiology

## 2020-03-24 ENCOUNTER — Other Ambulatory Visit: Payer: Self-pay | Admitting: Cardiology

## 2020-03-24 MED FILL — AMLODIPINE BESYLATE 5 MG TA: 5 | 90 days supply | Qty: 90 | Fill #0

## 2020-03-26 ENCOUNTER — Ambulatory Visit: Payer: 59 | Admitting: Pulmonary Disease

## 2020-03-26 ENCOUNTER — Encounter: Payer: Self-pay | Admitting: Pulmonary Disease

## 2020-03-26 ENCOUNTER — Other Ambulatory Visit: Payer: Self-pay | Admitting: Pulmonary Disease

## 2020-03-26 ENCOUNTER — Ambulatory Visit (INDEPENDENT_AMBULATORY_CARE_PROVIDER_SITE_OTHER): Payer: 59

## 2020-03-26 ENCOUNTER — Other Ambulatory Visit: Payer: Self-pay

## 2020-03-26 VITALS — BP 114/80 | HR 78 | Temp 97.8°F | Ht 62.0 in | Wt 147.8 lb

## 2020-03-26 DIAGNOSIS — R0600 Dyspnea, unspecified: Secondary | ICD-10-CM

## 2020-03-26 DIAGNOSIS — R0602 Shortness of breath: Secondary | ICD-10-CM | POA: Diagnosis not present

## 2020-03-26 MED ORDER — ALBUTEROL SULFATE HFA 108 (90 BASE) MCG/ACT IN AERS: 2.0000 | INHALATION_SPRAY | Freq: Four times a day (QID) | RESPIRATORY_TRACT | 6 refills | Status: DC | PRN

## 2020-03-26 MED FILL — ALBUTEROL SULFATE HFA 108 (: 108 (90 BAS | 25 days supply | Qty: 9 | Fill #0

## 2020-03-26 NOTE — Patient Instructions (Addendum)
Start albuterol inhaler 1-2 puffs every 4-6 hours as needed for shortness of breath  We will schedule you for pulmonary function tests  We will check a chest radiograph today

## 2020-03-26 NOTE — Progress Notes (Signed)
Synopsis: Referred in January 2020 by Adrian Prows, MD for shortness of breath  Subjective:   PATIENT ID: Rebecca Zamora: female DOB: Dec 07, 1965, MRN: [id number]   HPI  Chief Complaint  Patient presents with  . 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 Wheezing at night when laying down   Rebecca Zamora is a 55 year old woman, never smoker who is referred to pulmonary clinic for progressive shortness of breath and wheezing.   She reports she has been having issues with shortness of breath over the past 1 year. She reports she does have intermittent wheezing. She denies cough or night time awakenings due to cough or shortness of breath. 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   She has never smoked and denies second hand smoke exposure. She grew up in [place] and reports using a charcoal stove predominately for cooking. She has not been sick with covid since the pandemic started. She denies family history of lung problems in the family.   Past Medical History:  Diagnosis Date  . Cardiomegaly   . HTN (hypertension)   . No pertinent past medical history      Family History  Problem Relation Age of Onset  . Hypertension Mother   . Stroke Father   . Gout Father   . Stroke Sister   . Other Neg Hx   . Diabetes Neg Hx      Social History   Socioeconomic History  . Marital status: Divorced    Spouse name: Not on file  . Number of children: 2  . Years of education: Not on file  . Highest education level: Not on file  Occupational History  . Not on file  Tobacco Use  . Smoking status: Never Smoker  . Smokeless tobacco: Never Used  Vaping Use  . Vaping Use: Never used  Substance and Sexual Activity  . Alcohol use: Not Currently  . Drug use: No  . Sexual activity: Not Currently  Other Topics Concern  . Not on file  Social History Narrative  . Not on file    Social Determinants of Health   Financial Resource Strain: Not on file  Food Insecurity: Not on file  Transportation Needs: Not on file  Physical Activity: Not on file  Stress: Not on file  Social Connections: Not on file  Intimate Partner Violence: Not on file     Allergies  Allergen Reactions  . Nifedipine Other (See Comments)    Depression, nausea, fatigue  . Hydrocodone Nausea Only     Outpatient Medications Prior to Visit  Medication Sig Dispense Refill  . amLODipine (NORVASC) 5 MG tablet TAKE 1 TABLET (5 MG TOTAL) BY MOUTH DAILY. 90 tablet 0  . ASPIRIN 81 PO Take by mouth as needed.    . cyclobenzaprine (FLEXERIL) 10 MG tablet Take 1 tablet (10 mg total) by mouth 3 (three) times daily as needed for muscle spasms. 30 tablet 0  . mometasone (NASONEX) 50 MCG/ACT nasal spray Place 2 sprays into the nose daily. 1 each 2  . VITAMIN D PO Take 2,000 mg by mouth daily.     Marland Kitchen amLODipine (NORVASC) 2.5 MG tablet TAKE 1 TABLET BY MOUTH ONCE DAILY. 30 tablet 0  . predniSONE (STERAPRED UNI-PAK 21 TAB) 10 MG (21) TBPK tablet Take as directed 21 tablet 0   No  facility-administered medications prior to visit.    Review of Systems  Constitutional: Negative for chills, fever, malaise/fatigue and weight loss.  HENT: Negative for congestion, sinus pain and sore throat.   Eyes: Negative.   Respiratory: Positive for shortness of breath and wheezing. Negative for cough, hemoptysis and sputum production.   Cardiovascular: Negative for chest pain, palpitations, orthopnea, claudication and leg swelling.  Gastrointestinal: Positive for constipation. Negative for abdominal pain, heartburn, nausea and vomiting.  Genitourinary: Negative.   Musculoskeletal: Negative for joint pain and myalgias.  Skin: Negative for rash.  Neurological: Negative for weakness.  Endo/Heme/Allergies: Negative.   Psychiatric/Behavioral: Negative.    Objective:   Vitals:   03/26/20 1602  BP: 114/80  Pulse: 78   Temp: 97.8 F (36.6 C)  TempSrc: Oral  SpO2: 100%  Weight: 147 lb 12.8 oz (67 kg)  Height: 5\' 2"  (1.575 m)     Physical Exam Constitutional:      General: She is not in acute distress.    Appearance: She is not ill-appearing.  HENT:     Head: Normocephalic and atraumatic.  Eyes:     General: No scleral icterus.    Conjunctiva/sclera: Conjunctivae normal.     Pupils: Pupils are equal, round, and reactive to light.  Cardiovascular:     Rate and Rhythm: Normal rate and regular rhythm.     Pulses: Normal pulses.     Heart sounds: Normal heart sounds. No murmur heard.   Pulmonary:     Effort: Pulmonary effort is normal.     Breath sounds: Normal breath sounds. No wheezing, rhonchi or rales.  Abdominal:     General: Bowel sounds are normal.     Palpations: Abdomen is soft.  Musculoskeletal:     Right lower leg: No edema.     Left lower leg: No edema.  Lymphadenopathy:     Cervical: No cervical adenopathy.  Skin:    General: Skin is warm and dry.  Neurological:     General: No focal deficit present.     Mental Status: She is alert.  Psychiatric:        Mood and Affect: Mood normal.        Behavior: Behavior normal.        Thought Content: Thought content normal.        Judgment: Judgment normal.     CBC    Component Value Date/Time   WBC 4.1 12/02/2019 1547   WBC 4.9 02/19/2018 1343   RBC 4.71 12/02/2019 1547   RBC 4.98 02/19/2018 1343   HGB 13.8 12/02/2019 1547   HCT 41.4 12/02/2019 1547   PLT 218 12/02/2019 1547   MCV 88 12/02/2019 1547   MCH 29.3 12/02/2019 1547   MCH 29.1 02/19/2018 1343   MCHC 33.3 12/02/2019 1547   MCHC 32.3 02/19/2018 1343   RDW 13.8 12/02/2019 1547   BMP Latest Ref Rng & Units 12/02/2019 06/11/2019 02/26/2019  Glucose 65 - 99 mg/dL 71 72 80  BUN 6 - 24 mg/dL 14 14 16   Creatinine 0.57 - 1.00 mg/dL 1.17(H) 1.08(H) 0.98  BUN/Creat Ratio 9 - 23 12 13 16   Sodium 134 - 144 mmol/L 143 144 144  Potassium 3.5 - 5.2 mmol/L 3.4(L) 4.0 4.0   Chloride 96 - 106 mmol/L 99 103 104  CO2 20 - 29 mmol/L 25 25 26   Calcium 8.7 - 10.2 mg/dL 9.6 9.3 9.5   Chest imaging: CXR 03/26/20 The heart size and mediastinal contours are within normal limits.  Both lungs are clear. The visualized skeletal structures are Unremarkable.  PFT: No flowsheet data found.  Echo: 10/29/19 Left ventricle cavity is normal in size and wall thickness. Normal global  wall motion. Normal LV systolic function with EF 65%. Indeterminate  diastolic filling pattern.  Mild (Grade I) mitral regurgitation.  Mild tricuspid regurgitation. Estimated pulmonary artery systolic pressure  37 mmHg.  Small circumferential pericardial effusion with no hemodynamic  Significance.  Stress Test 11/12/19 Exercise treadmill stress test performed using Bruce protocol.  Patient reached 7.0 METS, and 75% of age predicted maximum heart rate.  Exercise capacity was low.  No chest pain reported.  Exertional dyspnea and dizziness reported. Normal heart rate response. Relatively flat blood pressure response. Peak exercise EKG showed 1-1.mm upsloping ST depression in lead II, normalized 2 min into recovery.  Sub-maximal exercise treadmill stress test with equivocal EKG changes. Consider further ischemic workup, as clinically indicated.   Assessment & Plan:   Dyspnea, unspecified type - Plan: Pulmonary Function Test, DG Chest 2 View  Discussion: Rebecca Zamora is a 55 year old woman, never smoker who is referred to pulmonary clinic for progressive shortness of breath and wheezing.   There is concern for underlying reactive airways disease or asthma based on her history of intermittent dyspnea and wheezing. We will trial her on albuterol inhaler and obtain pulmonary function testing. Her chest radiograph is unremarkable today, possibly with some features of hyperinflation with increased rib spaces and increased anterior airspace on lateral view.   Based on her PFTs and any symptom improvement  from albuterol we will consider adding maintenance inhaler.   Follow up in 3 months.  Freda Jackson, MD [facility] Office: [phone]   See Amion for Pager Details    Current Outpatient Medications:  .  albuterol (VENTOLIN HFA) 108 (90 Base) MCG/ACT inhaler, Inhale 2 puffs into the lungs every 6 (six) hours as needed for wheezing or shortness of breath., Disp: 8 g, Rfl: 6 .  amLODipine (NORVASC) 5 MG tablet, TAKE 1 TABLET (5 MG TOTAL) BY MOUTH DAILY., Disp: 90 tablet, Rfl: 0 .  ASPIRIN 81 PO, Take by mouth as needed., Disp: , Rfl:  .  cyclobenzaprine (FLEXERIL) 10 MG tablet, Take 1 tablet (10 mg total) by mouth 3 (three) times daily as needed for muscle spasms., Disp: 30 tablet, Rfl: 0 .  mometasone (NASONEX) 50 MCG/ACT nasal spray, Place 2 sprays into the nose daily., Disp: 1 each, Rfl: 2 .  VITAMIN D PO, Take 2,000 mg by mouth daily. , Disp: , Rfl:

## 2020-03-30 NOTE — Telephone Encounter (Signed)
Patient has already received the letter from her pulmonologist.

## 2020-04-05 ENCOUNTER — Telehealth: Payer: Self-pay

## 2020-04-05 ENCOUNTER — Inpatient Hospital Stay: Admission: RE | Admit: 2020-04-05 | Payer: 59 | Source: Ambulatory Visit

## 2020-04-05 NOTE — Telephone Encounter (Signed)
Patient consented to virtual appointment. YL,RMA

## 2020-04-06 ENCOUNTER — Other Ambulatory Visit: Payer: Self-pay | Admitting: Nurse Practitioner

## 2020-04-06 ENCOUNTER — Encounter: Payer: Self-pay | Admitting: Nurse Practitioner

## 2020-04-06 ENCOUNTER — Telehealth (INDEPENDENT_AMBULATORY_CARE_PROVIDER_SITE_OTHER): Payer: 59 | Admitting: Nurse Practitioner

## 2020-04-06 ENCOUNTER — Other Ambulatory Visit: Payer: Self-pay

## 2020-04-06 VITALS — BP 126/84 | HR 61 | Temp 100.8°F

## 2020-04-06 DIAGNOSIS — U071 COVID-19: Secondary | ICD-10-CM

## 2020-04-06 DIAGNOSIS — R059 Cough, unspecified: Secondary | ICD-10-CM

## 2020-04-06 DIAGNOSIS — L299 Pruritus, unspecified: Secondary | ICD-10-CM

## 2020-04-06 MED ORDER — HYDROXYZINE PAMOATE 25 MG PO CAPS: 25.0000 mg | ORAL_CAPSULE | Freq: Three times a day (TID) | ORAL | 0 refills | Status: DC | PRN

## 2020-04-06 MED ORDER — GUAIFENESIN ER 600 MG PO TB12: 600.0000 mg | ORAL_TABLET | Freq: Two times a day (BID) | ORAL | 2 refills | Status: DC

## 2020-04-06 MED ORDER — BENZONATATE 100 MG PO CAPS: 100.0000 mg | ORAL_CAPSULE | Freq: Four times a day (QID) | ORAL | 1 refills | Status: DC | PRN

## 2020-04-06 MED FILL — BENZONATATE 100 MG CAPS: 100 | 7 days supply | Qty: 30 | Fill #0

## 2020-04-06 MED FILL — HYDROXYZINE PAM 25 MG CAP: 25 | 10 days supply | Qty: 30 | Fill #0

## 2020-04-06 NOTE — Progress Notes (Signed)
Virtual Visit via My Chart   This visit type was conducted due to national recommendations for restrictions regarding the COVID-19 Pandemic (e.g. social distancing) in an effort to limit this patient's exposure and mitigate transmission in our community.  Due to her co-morbid illnesses, this patient is at least at moderate risk for complications without adequate follow up.  This format is felt to be most appropriate for this patient at this time.  All issues noted in this document were discussed and addressed.  A limited physical exam was performed with this format.    This visit type was conducted due to national recommendations for restrictions regarding the COVID-19 Pandemic (e.g. social distancing) in an effort to limit this patient's exposure and mitigate transmission in our community.  Patients identity confirmed using two different identifiers.  This format is felt to be most appropriate for this patient at this time.  All issues noted in this document were discussed and addressed.  No physical exam was performed (except for noted visual exam findings with Video Visits).    Date:  05/11/2020   ID:  Rebecca Zamora, Rebecca Zamora [birth year], MRN [id number]  Patient Location:  Home - spoke with Burton Apley  Provider location:   Office    Chief Complaint:    History of Present Illness:    Rebecca Zamora is a 55 y.o. female who presents via video conferencing for a telehealth visit today.    The patient does have symptoms concerning for COVID-19 infection (fever, chills, cough, or new shortness of breath).   Virtual visit for positive covid  Two weeks ago she had been exposed to covid.   Friday had cough, Saturday had covid test. She had done with [facility]. She has been running a fever, taking Tylenol.  Denies shortness of breath.      Past Medical History:  Diagnosis Date  . Cardiomegaly   . HTN (hypertension)   . No pertinent past medical history    Past Surgical History:   Procedure Laterality Date  . MYOMECTOMY  1999     Current Meds  Medication Sig  . albuterol (VENTOLIN HFA) 108 (90 Base) MCG/ACT inhaler Inhale 2 puffs into the lungs every 6 (six) hours as needed for wheezing or shortness of breath.  Marland Kitchen amLODipine (NORVASC) 5 MG tablet TAKE 1 TABLET (5 MG TOTAL) BY MOUTH DAILY.  . benzonatate (TESSALON PERLES) 100 MG capsule Take 1 capsule (100 mg total) by mouth every 6 (six) hours as needed for cough.  . cyclobenzaprine (FLEXERIL) 10 MG tablet Take 1 tablet (10 mg total) by mouth 3 (three) times daily as needed for muscle spasms.  Marland Kitchen guaiFENesin (MUCINEX) 600 MG 12 hr tablet Take 1 tablet (600 mg total) by mouth 2 (two) times daily.  . hydrOXYzine (VISTARIL) 25 MG capsule Take 1 capsule (25 mg total) by mouth 3 (three) times daily as needed.  Marland Kitchen VITAMIN D PO Take 2,000 mg by mouth daily.      Allergies:   Nifedipine and Hydrocodone   Social History   Tobacco Use  . Smoking status: Never Smoker  . Smokeless tobacco: Never Used  Vaping Use  . Vaping Use: Never used  Substance Use Topics  . Alcohol use: Not Currently  . Drug use: No     Family Hx: The patient's family history includes Gout in her father; Hypertension in her mother; Stroke in her father and sister. There is no history of Other or Diabetes.  ROS:  Please see the history of present illness.    Review of Systems  Constitutional: Positive for fever and malaise/fatigue.  Respiratory: Positive for cough. Negative for sputum production, shortness of breath and wheezing.   Cardiovascular: Negative for chest pain and orthopnea.  Psychiatric/Behavioral: Negative.     All other systems reviewed and are negative.   Labs/Other Tests and Data Reviewed:    Recent Labs: 12/02/2019: ALT 18; BUN 14; Creatinine, Ser 1.17; Hemoglobin 13.8; Platelets 218; Potassium 3.4; Sodium 143; TSH 1.120   Recent Lipid Panel Lab Results  Component Value Date/Time   CHOL 203 (H) 06/11/2019 12:07 PM    TRIG 46 06/11/2019 12:07 PM   HDL 91 06/11/2019 12:07 PM   CHOLHDL 2.2 06/11/2019 12:07 PM   LDLCALC 103 (H) 06/11/2019 12:07 PM    Wt Readings from Last 3 Encounters:  03/26/20 147 lb 12.8 oz (67 kg)  12/11/19 144 lb (65.3 kg)  12/02/19 146 lb 9.6 oz (66.5 kg)     Exam:    Vital Signs:  BP 126/84 (BP Location: Left Arm, Patient Position: Sitting, Cuff Size: Small)   Pulse 61   Temp (!) 100.8 F (38.2 C) (Oral)   LMP 01/21/2011     Physical Exam Vitals reviewed.  Constitutional:      General: She is not in acute distress.    Appearance: Normal appearance.     Comments: Appears she does not feel well  Pulmonary:     Effort: Pulmonary effort is normal. No respiratory distress.     Comments: She is having coughing during the visit Neurological:     Mental Status: She is alert and oriented to person, place, and time.  Psychiatric:        Mood and Affect: Mood normal.        Behavior: Behavior normal.        Thought Content: Thought content normal.        Judgment: Judgment normal.     ASSESSMENT & PLAN:    1. Cough  She is to take tessalon perles and guaifensin as needed  Advised if has shortness of breath or chest pain to go to ER for further evaluation - benzonatate (TESSALON PERLES) 100 MG capsule; Take 1 capsule (100 mg total) by mouth every 6 (six) hours as needed for cough.  Dispense: 30 capsule; Refill: 1 - guaiFENesin (MUCINEX) 600 MG 12 hr tablet; Take 1 tablet (600 mg total) by mouth 2 (two) times daily.  Dispense: 60 tablet; Refill: 2 - Ambulatory referral for Covid Treatment  2. Lab test positive for detection of COVID-19 virus  Referral made to covid treatment - Ambulatory referral for Covid Treatment  3. Pruritus  Will treat with hydroxyzine as needed - hydrOXYzine (VISTARIL) 25 MG capsule; Take 1 capsule (25 mg total) by mouth 3 (three) times daily as needed.  Dispense: 30 capsule; Refill: 0    COVID-19 Education: The signs and symptoms of  COVID-19 were discussed with the patient and how to seek care for testing (follow up with PCP or arrange E-visit).  The importance of social distancing was discussed today.  Patient Risk:   After full review of this patients clinical status, I feel that they are at least moderate risk at this time.  Time:   Today, I have spent 12.50 minutes/ seconds with the patient with telehealth technology discussing above diagnoses.     Medication Adjustments/Labs and Tests Ordered: Current medicines are reviewed at length with the patient today.  Concerns  regarding medicines are outlined above.   Tests Ordered: Orders Placed This Encounter  Procedures  . Ambulatory referral for Covid Treatment    Medication Changes: Meds ordered this encounter  Medications  . benzonatate (TESSALON PERLES) 100 MG capsule    Sig: Take 1 capsule (100 mg total) by mouth every 6 (six) hours as needed for cough.    Dispense:  30 capsule    Refill:  1  . guaiFENesin (MUCINEX) 600 MG 12 hr tablet    Sig: Take 1 tablet (600 mg total) by mouth 2 (two) times daily.    Dispense:  60 tablet    Refill:  2  . hydrOXYzine (VISTARIL) 25 MG capsule    Sig: Take 1 capsule (25 mg total) by mouth 3 (three) times daily as needed.    Dispense:  30 capsule    Refill:  0    Disposition:  Follow up prn  Signed, Minette Brine, FNP

## 2020-04-06 NOTE — Patient Instructions (Addendum)
Take 2 naproxen two times a day 3 for the next days Aspirin 81 mg daily  Tylenol as needed  claritin 10 mg daily

## 2020-04-07 ENCOUNTER — Ambulatory Visit (HOSPITAL_COMMUNITY): Payer: 59

## 2020-04-07 ENCOUNTER — Encounter: Payer: Self-pay | Admitting: Nurse Practitioner

## 2020-04-07 ENCOUNTER — Other Ambulatory Visit (HOSPITAL_COMMUNITY): Payer: Self-pay | Admitting: Family

## 2020-04-07 DIAGNOSIS — U071 COVID-19: Secondary | ICD-10-CM

## 2020-04-07 NOTE — Progress Notes (Signed)
I connected by phone with Rebecca Zamora on 04/07/2020 at 11:51 AM to discuss the potential use of a new treatment for mild to moderate COVID-19 viral infection in non-hospitalized patients.  This patient is a 55 y.o. female that meets the FDA criteria for Emergency Use Authorization of COVID monoclonal antibody sotrovimab.  Has a (+) direct SARS-CoV-2 viral test result  Has mild or moderate COVID-19   Is NOT hospitalized due to COVID-19  Is within 10 days of symptom onset  Has at least one of the high risk factor(s) for progression to severe COVID-19 and/or hospitalization as defined in EUA.  Specific high risk criteria : Cardiovascular disease or hypertension   Symptoms of cough and SOB began 04/03/2020.   I have spoken and communicated the following to the patient or parent/caregiver regarding COVID monoclonal antibody treatment:  1. FDA has authorized the emergency use for the treatment of mild to moderate COVID-19 in adults and pediatric patients with positive results of direct SARS-CoV-2 viral testing who are 15 years of age and older weighing at least 40 kg, and who are at high risk for progressing to severe COVID-19 and/or hospitalization.  2. The significant known and potential risks and benefits of COVID monoclonal antibody, and the extent to which such potential risks and benefits are unknown.  3. Information on available alternative treatments and the risks and benefits of those alternatives, including clinical trials.  4. Patients treated with COVID monoclonal antibody should continue to self-isolate and use infection control measures (e.g., wear mask, isolate, social distance, avoid sharing personal items, clean and disinfect "high touch" surfaces, and frequent handwashing) according to CDC guidelines.   5. The patient or parent/caregiver has the option to accept or refuse COVID monoclonal antibody treatment.  After reviewing this information with the patient, the  patient has agreed to receive one of the available covid 19 monoclonal antibodies and will be provided an appropriate fact sheet prior to infusion. [name], NP 04/07/2020 11:51 AM

## 2020-04-07 NOTE — Addendum Note (Signed)
Addended by: [name] on: 04/07/2020 11:58 AM   Modules accepted: Miquel Dunn

## 2020-04-08 ENCOUNTER — Other Ambulatory Visit: Payer: Self-pay | Admitting: Nurse Practitioner

## 2020-04-08 ENCOUNTER — Telehealth: Payer: Self-pay | Admitting: Pulmonary Disease

## 2020-04-08 DIAGNOSIS — R059 Cough, unspecified: Secondary | ICD-10-CM

## 2020-04-08 MED ORDER — PROMETHAZINE-DM 6.25-15 MG/5ML PO SYRP: 5.0000 mL | ORAL_SOLUTION | Freq: Four times a day (QID) | ORAL | 0 refills | Status: DC | PRN

## 2020-04-08 MED FILL — PROMETHAZINE W/DM SYRUP: 6.25-15 | 6 days supply | Qty: 118 | Fill #0

## 2020-04-08 NOTE — Telephone Encounter (Signed)
disregard

## 2020-04-10 ENCOUNTER — Telehealth: Payer: Self-pay | Admitting: Pulmonary Disease

## 2020-04-10 NOTE — Telephone Encounter (Signed)
Found accommodation forms in Dr. August Albino mailbox, in asking the front desk if this was dropped off by the patient - it was relayed to me that a man in a [employer] jacket and hat dropped of the forms on behalf of the patient. None of our paperwork has been given to the patient to complete. I will contact patient to advise of turn around times and fee to see if patient would like to proceed with form completion. -pr

## 2020-04-12 NOTE — Telephone Encounter (Signed)
LMTCB

## 2020-04-12 NOTE — Telephone Encounter (Signed)
Called and spoke to patient - advised of the necessity of coming to the office to complete her portion of our forms, and $29 fee. I prepared form for accommodations per patient and note from Dr. Erin Fulling pt unable to wear N95 mask. Given to Dr. Erin Fulling for signature. -pr

## 2020-04-13 DIAGNOSIS — Z0289 Encounter for other administrative examinations: Secondary | ICD-10-CM

## 2020-04-13 NOTE — Telephone Encounter (Signed)
Rec'd forms back - sent email to Kathlee Nations to drop charge. -pr

## 2020-04-15 NOTE — Telephone Encounter (Signed)
Charge dropped. Called and spoke to patient advised that forms are ready to be picked up. Office forms need to be signed, fee collected. All documents up front in filing cabinet -pr

## 2020-05-11 ENCOUNTER — Encounter: Payer: Self-pay | Admitting: Nurse Practitioner

## 2020-06-02 ENCOUNTER — Other Ambulatory Visit (HOSPITAL_COMMUNITY): Payer: Self-pay

## 2020-06-02 ENCOUNTER — Other Ambulatory Visit: Payer: Self-pay | Admitting: Cardiology

## 2020-06-02 MED ORDER — AMLODIPINE BESYLATE 5 MG PO TABS
5.0000 mg | ORAL_TABLET | Freq: Every day | ORAL | 0 refills | Status: DC
  Filled 2020-06-02 – 2020-06-21 (×2): qty 90, 90d supply, fill #0

## 2020-06-03 ENCOUNTER — Other Ambulatory Visit (HOSPITAL_COMMUNITY): Payer: Self-pay

## 2020-06-04 ENCOUNTER — Other Ambulatory Visit (HOSPITAL_COMMUNITY): Payer: Self-pay

## 2020-06-07 DIAGNOSIS — H524 Presbyopia: Secondary | ICD-10-CM | POA: Diagnosis not present

## 2020-06-07 DIAGNOSIS — H52223 Regular astigmatism, bilateral: Secondary | ICD-10-CM | POA: Diagnosis not present

## 2020-06-14 ENCOUNTER — Other Ambulatory Visit (HOSPITAL_COMMUNITY): Payer: Self-pay

## 2020-06-15 ENCOUNTER — Encounter: Payer: Self-pay | Admitting: Nurse Practitioner

## 2020-06-15 ENCOUNTER — Other Ambulatory Visit: Payer: Self-pay

## 2020-06-15 ENCOUNTER — Other Ambulatory Visit (HOSPITAL_COMMUNITY): Payer: Self-pay

## 2020-06-15 ENCOUNTER — Ambulatory Visit (INDEPENDENT_AMBULATORY_CARE_PROVIDER_SITE_OTHER): Payer: 59 | Admitting: Nurse Practitioner

## 2020-06-15 VITALS — BP 118/80 | HR 67 | Temp 97.9°F | Ht 60.8 in | Wt 143.0 lb

## 2020-06-15 DIAGNOSIS — Z8616 Personal history of COVID-19: Secondary | ICD-10-CM

## 2020-06-15 DIAGNOSIS — H6122 Impacted cerumen, left ear: Secondary | ICD-10-CM | POA: Diagnosis not present

## 2020-06-15 DIAGNOSIS — E8881 Metabolic syndrome: Secondary | ICD-10-CM | POA: Diagnosis not present

## 2020-06-15 DIAGNOSIS — R220 Localized swelling, mass and lump, head: Secondary | ICD-10-CM | POA: Diagnosis not present

## 2020-06-15 DIAGNOSIS — M62838 Other muscle spasm: Secondary | ICD-10-CM | POA: Diagnosis not present

## 2020-06-15 DIAGNOSIS — Z1159 Encounter for screening for other viral diseases: Secondary | ICD-10-CM | POA: Diagnosis not present

## 2020-06-15 DIAGNOSIS — Z0000 Encounter for general adult medical examination without abnormal findings: Secondary | ICD-10-CM

## 2020-06-15 DIAGNOSIS — M2550 Pain in unspecified joint: Secondary | ICD-10-CM

## 2020-06-15 DIAGNOSIS — E88819 Insulin resistance, unspecified: Secondary | ICD-10-CM

## 2020-06-15 DIAGNOSIS — R21 Rash and other nonspecific skin eruption: Secondary | ICD-10-CM | POA: Diagnosis not present

## 2020-06-15 DIAGNOSIS — Z820 Family history of epilepsy and other diseases of the nervous system: Secondary | ICD-10-CM

## 2020-06-15 DIAGNOSIS — I10 Essential (primary) hypertension: Secondary | ICD-10-CM | POA: Diagnosis not present

## 2020-06-15 LAB — POCT URINALYSIS DIPSTICK
Bilirubin, UA: NEGATIVE
Blood, UA: NEGATIVE
Glucose, UA: NEGATIVE
Ketones, UA: NEGATIVE
Nitrite, UA: NEGATIVE
Protein, UA: NEGATIVE
Spec Grav, UA: 1.02 (ref 1.010–1.025)
Urobilinogen, UA: 0.2 E.U./dL
pH, UA: 7 (ref 5.0–8.0)

## 2020-06-15 LAB — POCT UA - MICROALBUMIN
Albumin/Creatinine Ratio, Urine, POC: <30
Creatinine, POC: 200 mg/dL
Microalbumin Ur, POC: 10 mg/L

## 2020-06-15 MED ORDER — CYCLOBENZAPRINE HCL 10 MG PO TABS
10.0000 mg | ORAL_TABLET | Freq: Three times a day (TID) | ORAL | 0 refills | Status: DC | PRN
  Filled 2020-06-15: qty 30, 10d supply, fill #0

## 2020-06-15 MED ORDER — HYDROCORTISONE 1 % EX CREA
TOPICAL_CREAM | CUTANEOUS | 1 refills | Status: AC
  Filled 2020-06-15: qty 30, fill #0

## 2020-06-15 NOTE — Patient Instructions (Signed)
Health Maintenance, Female Adopting a healthy lifestyle and getting preventive care are important in promoting health and wellness. Ask your health care provider about:  The right schedule for you to have regular tests and exams.  Things you can do on your own to prevent diseases and keep yourself healthy. What should I know about diet, weight, and exercise? Eat a healthy diet  Eat a diet that includes plenty of vegetables, fruits, low-fat dairy products, and lean protein.  Do not eat a lot of foods that are high in solid fats, added sugars, or sodium.   Maintain a healthy weight Body mass index (BMI) is used to identify weight problems. It estimates body fat based on height and weight. Your health care provider can help determine your BMI and help you achieve or maintain a healthy weight. Get regular exercise Get regular exercise. This is one of the most important things you can do for your health. Most adults should:  Exercise for at least 150 minutes each week. The exercise should increase your heart rate and make you sweat (moderate-intensity exercise).  Do strengthening exercises at least twice a week. This is in addition to the moderate-intensity exercise.  Spend less time sitting. Even light physical activity can be beneficial. Watch cholesterol and blood lipids Have your blood tested for lipids and cholesterol at 54 years of age, then have this test every 5 years. Have your cholesterol levels checked more often if:  Your lipid or cholesterol levels are high.  You are older than 54 years of age.  You are at high risk for heart disease. What should I know about cancer screening? Depending on your health history and family history, you may need to have cancer screening at various ages. This may include screening for:  Breast cancer.  Cervical cancer.  Colorectal cancer.  Skin cancer.  Lung cancer. What should I know about heart disease, diabetes, and high blood  pressure? Blood pressure and heart disease  High blood pressure causes heart disease and increases the risk of stroke. This is more likely to develop in people who have high blood pressure readings, are of African descent, or are overweight.  Have your blood pressure checked: ? Every 3-5 years if you are 18-39 years of age. ? Every year if you are 40 years old or older. Diabetes Have regular diabetes screenings. This checks your fasting blood sugar level. Have the screening done:  Once every three years after age 40 if you are at a normal weight and have a low risk for diabetes.  More often and at a younger age if you are overweight or have a high risk for diabetes. What should I know about preventing infection? Hepatitis B If you have a higher risk for hepatitis B, you should be screened for this virus. Talk with your health care provider to find out if you are at risk for hepatitis B infection. Hepatitis C Testing is recommended for:  Everyone born from 1945 through 1965.  Anyone with known risk factors for hepatitis C. Sexually transmitted infections (STIs)  Get screened for STIs, including gonorrhea and chlamydia, if: ? You are sexually active and are younger than 54 years of age. ? You are older than 54 years of age and your health care provider tells you that you are at risk for this type of infection. ? Your sexual activity has changed since you were last screened, and you are at increased risk for chlamydia or gonorrhea. Ask your health care provider   if you are at risk.  Ask your health care provider about whether you are at high risk for HIV. Your health care provider may recommend a prescription medicine to help prevent HIV infection. If you choose to take medicine to prevent HIV, you should first get tested for HIV. You should then be tested every 3 months for as long as you are taking the medicine. Pregnancy  If you are about to stop having your period (premenopausal) and  you may become pregnant, seek counseling before you get pregnant.  Take 400 to 800 micrograms (mcg) of folic acid every day if you become pregnant.  Ask for birth control (contraception) if you want to prevent pregnancy. Osteoporosis and menopause Osteoporosis is a disease in which the bones lose minerals and strength with aging. This can result in bone fractures. If you are 65 years old or older, or if you are at risk for osteoporosis and fractures, ask your health care provider if you should:  Be screened for bone loss.  Take a calcium or vitamin D supplement to lower your risk of fractures.  Be given hormone replacement therapy (HRT) to treat symptoms of menopause. Follow these instructions at home: Lifestyle  Do not use any products that contain nicotine or tobacco, such as cigarettes, e-cigarettes, and chewing tobacco. If you need help quitting, ask your health care provider.  Do not use street drugs.  Do not share needles.  Ask your health care provider for help if you need support or information about quitting drugs. Alcohol use  Do not drink alcohol if: ? Your health care provider tells you not to drink. ? You are pregnant, may be pregnant, or are planning to become pregnant.  If you drink alcohol: ? Limit how much you use to 0-1 drink a day. ? Limit intake if you are breastfeeding.  Be aware of how much alcohol is in your drink. In the [place] one drink equals one 12 oz bottle of beer (355 mL), one 5 oz glass of wine (148 mL), or one 1 oz glass of hard liquor (44 mL). General instructions  Schedule regular health, dental, and eye exams.  Stay current with your vaccines.  Tell your health care provider if: ? You often feel depressed. ? You have ever been abused or do not feel safe at home. Summary  Adopting a healthy lifestyle and getting preventive care are important in promoting health and wellness.  Follow your health care provider's instructions about healthy  diet, exercising, and getting tested or screened for diseases.  Follow your health care provider's instructions on monitoring your cholesterol and blood pressure. This information is not intended to replace advice given to you by your health care provider. Make sure you discuss any questions you have with your health care provider. Document Revised: 01/30/2018 Document Reviewed: 01/30/2018 [employer]vier Patient Education  2021 [employer]  

## 2020-06-15 NOTE — Progress Notes (Signed)
I,Yamilka Roman [employer] as a Education administrator for [employer], FNP.,have documented all relevant documentation on the behalf of Minette Brine, FNP,as directed by  Minette Brine, FNP while in the presence of Minette Brine, Dugger. This visit occurred during the SARS-CoV-2 public health emergency.  Safety protocols were in place, including screening questions prior to the visit, additional usage of staff PPE, and extensive cleaning of exam room while observing appropriate contact time as indicated for disinfecting solutions.  Subjective:     Patient ID: Rebecca Zamora , female    DOB: 11-Jul-1965 , 55 y.o.   MRN: [id number]   Chief Complaint  Patient presents with  . Annual Exam    HPI  Patient presents today for her physical.  She states "after having covid she noticed her lips have been swelling and having difficulty with swallowing".  She has a Film/video editor Dr. Einar Gip. She was referred to Pulmonology to evaluate her for symptoms of dyspnea which was normal.    Weight at home was 142 lbs  Wt Readings from Last 3 Encounters: 06/15/20 : 143 lb (64.9 kg) 03/26/20 : 147 lb 12.8 oz (67 kg) 12/11/19 : 144 lb (65.3 kg)     Past Medical History:  Diagnosis Date  . Cardiomegaly   . HTN (hypertension)   . No pertinent past medical history      Family History  Problem Relation Age of Onset  . Hypertension Mother   . Stroke Father   . Gout Father   . Stroke Sister   . Other Neg Hx   . Diabetes Neg Hx      Current Outpatient Medications:  .  albuterol (VENTOLIN HFA) 108 (90 Base) MCG/ACT inhaler, INHALE 2 PUFFS INTO THE LUNGS EVERY 6 (SIX) HOURS AS NEEDED FOR WHEEZING OR SHORTNESS OF BREATH., Disp: 8.5 g, Rfl: 6 .  amLODipine (NORVASC) 5 MG tablet, Take 1 tablet (5 mg total) by mouth daily., Disp: 90 tablet, Rfl: 0 .  ASPIRIN 81 PO, Take by mouth as needed., Disp: , Rfl:  .  hydrocortisone cream 1 %, Apply to affected area 2 times daily, Disp: 30 g, Rfl: 1 .  VITAMIN D PO, Take 2,000 mg by  mouth daily. , Disp: , Rfl:  .  cyclobenzaprine (FLEXERIL) 10 MG tablet, TAKE 1 TABLET (10 MG TOTAL) BY MOUTH 3 (THREE) TIMES DAILY AS NEEDED FOR MUSCLE SPASMS., Disp: 30 tablet, Rfl: 0 .  mometasone (NASONEX) 50 MCG/ACT nasal spray, PLACE 2 SPRAYS INTO THE NOSE DAILY. (Patient not taking: Reported on 06/15/2020), Disp: 17 g, Rfl: 2   Allergies  Allergen Reactions  . Nifedipine Other (See Comments)    Depression, nausea, fatigue  . Hydrocodone Nausea Only      The patient states she uses post menopausal status for birth control. Last LMP was Patient's last menstrual period was 01/21/2011.. Negative for Dysmenorrhea and Negative for Menorrhagia. Negative for: breast discharge, breast lump(s), breast pain and breast self exam. Associated symptoms include abnormal vaginal bleeding. Pertinent negatives include abnormal bleeding (hematology), anxiety, decreased libido, depression, difficulty falling sleep, dyspareunia, history of infertility, nocturia, sexual dysfunction, sleep disturbances, urinary incontinence, urinary urgency, vaginal discharge and vaginal itching. Diet regular.The patient states her exercise level is moderate - 6 days a week with cardio.  The patient's tobacco use is:  Social History   Tobacco Use  Smoking Status Never Smoker  Smokeless Tobacco Never Used  . She has been exposed to passive smoke. The patient's alcohol use is:  Social History  Substance and Sexual Activity  Alcohol Use Not Currently   Additional information: Last pap 06/10/18, next one scheduled for 06/10/2019.    Review of Systems  Constitutional: Negative.   HENT: Negative.        Reports having swelling to upper mouth membrane intermittently  Eyes: Negative.   Respiratory: Negative.  Negative for cough.   Cardiovascular: Negative.  Negative for chest pain, palpitations and leg swelling.  Gastrointestinal: Negative.   Endocrine: Negative.   Genitourinary: Negative.   Musculoskeletal: Negative.    Skin: Negative.   Allergic/Immunologic: Negative.   Neurological: Negative.   Hematological: Negative.   Psychiatric/Behavioral: Negative.      Today's Vitals   06/15/20 0843  BP: 118/80  Pulse: 67  Temp: 97.9 F (36.6 C)  TempSrc: Oral  Weight: 143 lb (64.9 kg)  Height: 5' 0.8" (1.544 m)  PainSc: 0-No pain   Body mass index is 27.2 kg/m.   Objective:  Physical Exam Vitals reviewed.  Constitutional:      General: She is not in acute distress.    Appearance: Normal appearance. She is well-developed. She is obese.  HENT:     Head: Normocephalic and atraumatic.     Right Ear: Hearing, tympanic membrane, ear canal and external ear normal. There is no impacted cerumen.     Left Ear: Hearing and external ear normal. There is impacted cerumen.     Nose:     Comments: Deferred - masked    Mouth/Throat:     Comments: Deferred - masked Eyes:     General: Lids are normal.     Extraocular Movements: Extraocular movements intact.     Conjunctiva/sclera: Conjunctivae normal.     Pupils: Pupils are equal, round, and reactive to light.     Funduscopic exam:    Right eye: No papilledema.        Left eye: No papilledema.  Neck:     Thyroid: No thyroid mass.     Vascular: No carotid bruit.  Cardiovascular:     Rate and Rhythm: Normal rate and regular rhythm.     Pulses: Normal pulses.     Heart sounds: Normal heart sounds. No murmur heard.   Pulmonary:     Effort: Pulmonary effort is normal. No respiratory distress.     Breath sounds: Normal breath sounds. No wheezing.  Abdominal:     General: Abdomen is flat. Bowel sounds are normal. There is no distension.     Palpations: Abdomen is soft.  Musculoskeletal:        General: No swelling. Normal range of motion.     Cervical back: Full passive range of motion without pain, normal range of motion and neck supple. No rigidity.     Right lower leg: No edema.     Left lower leg: No edema.  Skin:    General: Skin is warm and  dry.     Capillary Refill: Capillary refill takes less than 2 seconds.  Neurological:     General: No focal deficit present.     Mental Status: She is alert and oriented to person, place, and time.     Cranial Nerves: No cranial nerve deficit.     Sensory: No sensory deficit.     Motor: No weakness.  Psychiatric:        Mood and Affect: Mood normal.        Behavior: Behavior normal.        Thought Content: Thought content normal.  Judgment: Judgment normal.         Assessment And Plan:     1. Encounter for general adult medical examination w/o abnormal findings  . Behavior modifications discussed and diet history reviewed.   . Pt will continue to exercise regularly and modify diet with low GI, plant based foods and decrease intake of processed foods.  . Recommend intake of daily multivitamin, Vitamin D, and calcium.  . Recommend mammogram and colonoscopy (both are up to date) for preventive screenings, as well as recommend immunizations that include influenza, TDAP (up to date) - CBC - VITAMIN D 25 Hydroxy (Vit-D Deficiency, Fractures) - Lipid panel  2. Encounter for hepatitis C screening test for low risk patient  Will check Hepatitis C screening due to recent recommendations to screen all adults 18 years and older - Hepatitis C antibody  3. Essential hypertension  Chronic, well controlled   Continue follow up with Dr. Einar Gip - POCT Urinalysis Dipstick (81002) - POCT UA - Microalbumin - CMP14+EGFR  4. Insulin resistance  Will check HgbA1c - Hemoglobin A1c  5. Rash and nonspecific skin eruption  She has a fine rash to upper shoulders at the nape of her neck  Will try hydrocortisone cream  Will check allergy panel as well - Allergens(96) Foods - Allergens, Zone 2  6. Lip swelling  No swelling noted at this time, she did show a picture of swelling to her upper palate none present at this time - Allergens(96) Foods - Allergens, Zone 2  7. Arthralgia,  unspecified joint - Autoimmune Profile  8. Family history of multiple sclerosis  9. History of COVID-19  10. Muscle spasm  May take muscle relaxer as needed - cyclobenzaprine (FLEXERIL) 10 MG tablet; TAKE 1 TABLET (10 MG TOTAL) BY MOUTH 3 (THREE) TIMES DAILY AS NEEDED FOR MUSCLE SPASMS.  Dispense: 30 tablet; Refill: 0  11. Impacted cerumen of left ear  Water lavage done with removal of cotton from Qtip  She is advised to avoid using qtips to clean her ears - Ear Lavage    Patient was given opportunity to ask questions. Patient verbalized understanding of the plan and was able to repeat key elements of the plan. All questions were answered to their satisfaction.   Minette Brine, FNP   I, Minette Brine, FNP, have reviewed all documentation for this visit. The documentation on 06/15/20 for the exam, diagnosis, procedures, and orders are all accurate and complete.   THE PATIENT IS ENCOURAGED TO PRACTICE SOCIAL DISTANCING DUE TO THE COVID-19 PANDEMIC.

## 2020-06-21 ENCOUNTER — Other Ambulatory Visit (HOSPITAL_COMMUNITY): Payer: Self-pay

## 2020-06-21 LAB — ALLERGENS, ZONE 2
Alternaria Alternata IgE: <0.1 kU/L
Amer Sycamore IgE Qn: 0.41 kU/L — AB
Aspergillus Fumigatus IgE: <0.1 kU/L
Bahia Grass IgE: <0.1 kU/L
Bermuda Grass IgE: <0.1 kU/L
Cat Dander IgE: <0.1 kU/L
Cedar, Mountain IgE: <0.1 kU/L
Cladosporium Herbarum IgE: <0.1 kU/L
Cockroach, American IgE: <0.1 kU/L
Common Silver Birch IgE: 3.94 kU/L — AB
D Farinae IgE: <0.1 kU/L
D Pteronyssinus IgE: <0.1 kU/L
Dog Dander IgE: <0.1 kU/L
Elm, American IgE: 0.8 kU/L — AB
Hickory, White IgE: 10.7 kU/L — AB
Johnson Grass IgE: <0.1 kU/L
Maple/Box Elder IgE: 0.91 kU/L — AB
Mucor Racemosus IgE: <0.1 kU/L
Mugwort IgE Qn: <0.1 kU/L
Nettle IgE: 0.35 kU/L — AB
Oak, White IgE: 3.94 kU/L — AB
Penicillium Chrysogen IgE: <0.1 kU/L
Pigweed, Rough IgE: <0.1 kU/L
Plantain, English IgE: <0.1 kU/L
Ragweed, Short IgE: <0.1 kU/L
Sheep Sorrel IgE Qn: 0.18 kU/L — AB
Stemphylium Herbarum IgE: <0.1 kU/L
Sweet gum IgE RAST Ql: 2.68 kU/L — AB
Timothy Grass IgE: <0.1 kU/L
White Mulberry IgE: <0.1 kU/L

## 2020-06-21 LAB — CMP14+EGFR
ALT: 15 IU/L (ref 0–32)
AST: 26 IU/L (ref 0–40)
Albumin/Globulin Ratio: 1.5 (ref 1.2–2.2)
Albumin: 4.1 g/dL (ref 3.8–4.9)
Alkaline Phosphatase: 104 IU/L (ref 44–121)
BUN/Creatinine Ratio: 15 (ref 9–23)
BUN: 16 mg/dL (ref 6–24)
Bilirubin Total: 0.4 mg/dL (ref 0.0–1.2)
CO2: 24 mmol/L (ref 20–29)
Calcium: 9.3 mg/dL (ref 8.7–10.2)
Chloride: 105 mmol/L (ref 96–106)
Creatinine, Ser: 1.06 mg/dL — ABNORMAL HIGH (ref 0.57–1.00)
Globulin, Total: 2.7 g/dL (ref 1.5–4.5)
Glucose: 79 mg/dL (ref 65–99)
Potassium: 4.1 mmol/L (ref 3.5–5.2)
Sodium: 145 mmol/L — ABNORMAL HIGH (ref 134–144)
Total Protein: 6.8 g/dL (ref 6.0–8.5)
eGFR: 63 mL/min/{1.73_m2} (ref >59)

## 2020-06-21 LAB — ALLERGENS (95) FOODS IGE
Allergen Apple, IgE: <0.1 kU/L
Allergen Banana IgE: <0.1 kU/L
Allergen Barley IgE: <0.1 kU/L
Allergen Black Pepper IgE: <0.1 kU/L
Allergen Blueberry IgE: <0.1 kU/L
Allergen Broccoli: <0.1 kU/L
Allergen Cabbage IgE: 0.11 kU/L — AB
Allergen Carrot IgE: <0.1 kU/L
Allergen Cauliflower IgE: 0.16 kU/L — AB
Allergen Celery IgE: <0.1 kU/L
Allergen Cinnamon IgE: <0.1 kU/L
Allergen Coconut IgE: 0.12 kU/L — AB
Allergen Corn, IgE: <0.1 kU/L
Allergen Cucumber IgE: <0.1 kU/L
Allergen Garlic IgE: <0.1 kU/L
Allergen Ginger IgE: <0.1 kU/L
Allergen Gluten IgE: <0.1 kU/L
Allergen Grape IgE: <0.1 kU/L
Allergen Grapefruit IgE: <0.1 kU/L
Allergen Green Bean IgE: <0.1 kU/L
Allergen Green Pea IgE: <0.1 kU/L
Allergen Lamb IgE: <0.1 kU/L
Allergen Lettuce IgE: <0.1 kU/L
Allergen Lime IgE: <0.1 kU/L
Allergen Melon IgE: <0.1 kU/L
Allergen Oat IgE: <0.1 kU/L
Allergen Onion IgE: <0.1 kU/L
Allergen Pear IgE: 0.14 kU/L — AB
Allergen Potato, White IgE: <0.1 kU/L
Allergen Rice IgE: <0.1 kU/L
Allergen Salmon IgE: <0.1 kU/L
Allergen Strawberry IgE: <0.1 kU/L
Allergen Sweet Potato IgE: <0.1 kU/L
Allergen Tomato, IgE: <0.1 kU/L
Allergen Turkey IgE: <0.1 kU/L
Allergen Watermelon IgE: <0.1 kU/L
Allergen, Peach f95: 0.22 kU/L — AB
Basil: <0.1 kU/L
Beef IgE: <0.1 kU/L
C074-IgE Gelatin: <0.1 kU/L
Chicken IgE: <0.1 kU/L
Chocolate/Cacao IgE: <0.1 kU/L
Clam IgE: <0.1 kU/L
Codfish IgE: <0.1 kU/L
Coffee: <0.1 kU/L
Cranberry IgE: <0.1 kU/L
Egg White IgE: <0.1 kU/L
F020-IgE Almond: <0.1 kU/L
F023-IgE Crab: <0.1 kU/L
F045-IgE Yeast: <0.1 kU/L
F076-IgE Alpha Lactalbumin: <0.1 kU/L
F077-IgE Beta Lactoglobulin: <0.1 kU/L
F078-IgE Casein: <0.1 kU/L
F080-IgE Lobster: <0.1 kU/L
F081-IgE Cheese, Cheddar Type: <0.1 kU/L
F089-IgE Mustard: <0.1 kU/L
F096-IgE Avocado: <0.1 kU/L
F202-IgE Cashew Nut: <0.1 kU/L
F214-IgE Spinach: <0.1 kU/L
F222-IgE Tea: <0.1 kU/L
F242-IgE Bing Cherry: 0.2 kU/L — AB
F261-IgE Asparagus: <0.1 kU/L
F262-IgE Eggplant: <0.1 kU/L
F265-IgE Cumin: <0.1 kU/L
F278-IgE Bayleaf (Laurel): <0.1 kU/L
F279-IgE Chili Pepper: <0.1 kU/L
F283-IgE Oregano: <0.1 kU/L
F300-IgE Goat's Milk: <0.1 kU/L
F342-IgE Olive, Black: <0.1 kU/L
F343-IgE Raspberry: <0.1 kU/L
Hops: <0.1 kU/L
IgE Egg (Yolk): <0.1 kU/L
Kidney Bean IgE: <0.1 kU/L
Lemon: <0.1 kU/L
Lima Bean IgE: <0.1 kU/L
Malt: <0.1 kU/L
Mushroom IgE: <0.1 kU/L
Orange: <0.1 kU/L
Paprika IgE: <0.1 kU/L
Peanut IgE: <0.1 kU/L
Pineapple IgE: <0.1 kU/L
Pork IgE: <0.1 kU/L
Pumpkin IgE: <0.1 kU/L
Red Beet: <0.1 kU/L
Rye IgE: <0.1 kU/L
Scallop IgE: <0.1 kU/L
Sesame Seed IgE: 0.1 kU/L — AB
Shrimp IgE: <0.1 kU/L
Soybean IgE: <0.1 kU/L
Tuna: <0.1 kU/L
Vanilla: <0.1 kU/L
Walnut IgE: <0.1 kU/L
Wheat IgE: <0.1 kU/L
Whey: <0.1 kU/L
White Bean IgE: <0.1 kU/L

## 2020-06-21 LAB — CBC
Hematocrit: 42.1 % (ref 34.0–46.6)
Hemoglobin: 13.5 g/dL (ref 11.1–15.9)
MCH: 28.7 pg (ref 26.6–33.0)
MCHC: 32.1 g/dL (ref 31.5–35.7)
MCV: 89 fL (ref 79–97)
Platelets: 225 10*3/uL (ref 150–450)
RBC: 4.71 x10E6/uL (ref 3.77–5.28)
RDW: 13.6 % (ref 11.7–15.4)
WBC: 4.1 10*3/uL (ref 3.4–10.8)

## 2020-06-21 LAB — HEMOGLOBIN A1C
Est. average glucose Bld gHb Est-mCnc: 108 mg/dL
Hgb A1c MFr Bld: 5.4 % (ref 4.8–5.6)

## 2020-06-21 LAB — AUTOIMMUNE PROFILE
Anti Nuclear Antibody (ANA): POSITIVE — AB
Complement C3, Serum: 133 mg/dL (ref 82–167)
dsDNA Ab: <1 IU/mL (ref 0–9)

## 2020-06-21 LAB — LIPID PANEL
Chol/HDL Ratio: 2.7 ratio (ref 0.0–4.4)
Cholesterol, Total: 199 mg/dL (ref 100–199)
HDL: 73 mg/dL (ref >39)
LDL Chol Calc (NIH): 115 mg/dL — ABNORMAL HIGH (ref 0–99)
Triglycerides: 61 mg/dL (ref 0–149)
VLDL Cholesterol Cal: 11 mg/dL (ref 5–40)

## 2020-06-21 LAB — HEPATITIS C ANTIBODY: Hep C Virus Ab: <0.1 s/co ratio (ref 0.0–0.9)

## 2020-06-21 LAB — VITAMIN D 25 HYDROXY (VIT D DEFICIENCY, FRACTURES): Vit D, 25-Hydroxy: 46 ng/mL (ref 30.0–100.0)

## 2020-06-25 ENCOUNTER — Other Ambulatory Visit: Payer: Self-pay | Admitting: Nurse Practitioner

## 2020-06-25 DIAGNOSIS — Z9109 Other allergy status, other than to drugs and biological substances: Secondary | ICD-10-CM

## 2020-06-28 ENCOUNTER — Telehealth: Payer: Self-pay

## 2020-06-28 NOTE — Telephone Encounter (Signed)
Labs discussed with pt YL,RMA

## 2020-09-27 ENCOUNTER — Other Ambulatory Visit (HOSPITAL_COMMUNITY): Payer: Self-pay

## 2020-09-27 ENCOUNTER — Other Ambulatory Visit: Payer: Self-pay | Admitting: Cardiology

## 2020-09-27 MED ORDER — AMLODIPINE BESYLATE 5 MG PO TABS
5.0000 mg | ORAL_TABLET | Freq: Every day | ORAL | 0 refills | Status: DC
  Filled 2020-09-27: qty 90, 90d supply, fill #0

## 2020-09-28 ENCOUNTER — Encounter: Payer: Self-pay | Admitting: Allergy & Immunology

## 2020-09-28 ENCOUNTER — Ambulatory Visit: Payer: 59 | Admitting: Allergy & Immunology

## 2020-09-28 ENCOUNTER — Other Ambulatory Visit: Payer: Self-pay

## 2020-09-28 ENCOUNTER — Other Ambulatory Visit (HOSPITAL_COMMUNITY): Payer: Self-pay

## 2020-09-28 VITALS — BP 120/82 | HR 76 | Temp 97.4°F | Ht 62.0 in | Wt 152.2 lb

## 2020-09-28 DIAGNOSIS — R768 Other specified abnormal immunological findings in serum: Secondary | ICD-10-CM

## 2020-09-28 DIAGNOSIS — L508 Other urticaria: Secondary | ICD-10-CM

## 2020-09-28 DIAGNOSIS — J301 Allergic rhinitis due to pollen: Secondary | ICD-10-CM | POA: Diagnosis not present

## 2020-09-28 MED ORDER — TRIAMCINOLONE ACETONIDE 0.1 % EX OINT
1.0000 "application " | TOPICAL_OINTMENT | Freq: Two times a day (BID) | CUTANEOUS | 1 refills | Status: AC | PRN
  Filled 2020-09-28: qty 454, 30d supply, fill #0

## 2020-09-28 MED ORDER — EPINEPHRINE 0.3 MG/0.3ML IJ SOAJ
0.3000 mg | INTRAMUSCULAR | 1 refills | Status: AC | PRN
  Filled 2020-09-28: qty 2, 15d supply, fill #0

## 2020-09-28 MED ORDER — EUCRISA 2 % EX OINT
1.0000 "application " | TOPICAL_OINTMENT | Freq: Two times a day (BID) | CUTANEOUS | 3 refills | Status: AC | PRN
  Filled 2020-09-28: qty 100, 30d supply, fill #0

## 2020-09-28 NOTE — Progress Notes (Signed)
NEW PATIENT  Date of Service/Encounter:  09/28/20  Consult requested by: Minette Brine, FNP   Assessment:   Chronic urticaria - with reported episodes of throat closure  Seasonal allergic rhinitis (grasses, trees, weeds, dogs)  Elevated ANA - obtaining further labs, including reflex ANA and titer  Plan/Recommendations:   1. Chronic urticaria - We do not need to do more allergy testing since your NP did a lot of testing already.  - We will get some labs to rule out serious causes of hives: complete blood count, tryptase level, chronic urticaria panel, ESR, and CRP. - We are also going to look more into the ANA (which is a lab that can be positive with autoimmune disease) - I want to see this rash when it happens again.  - EpiPen training provided in case you need it.  - In the meantime, start suppressive dosing of antihistamines:   - Morning: Allegra (fexofenadine) 371m (two tablets)   - Evening: Zyrtec (cetirizine) 240m(two tablets)  - If the above is not working, try adding: Pepcid (famotidine) 2046m You can change this dosing at home, decreasing the dose as needed or increasing the dosing as needed.   2. Seasonal allergic rhinitis - Previous testing was positive to weeds and trees. - Testing today was to grasses and dog. - Copy of testing results provided. - The antihistamines should help.    3. Return in about 3 months (around 12/29/2020).     This note in its entirety was forwarded to the Provider who requested this consultation.  Subjective:   Rebecca Zamora a 55 18o. female presenting today for evaluation of  Chief Complaint  Patient presents with   EstWavess a history of the following: Patient Active Problem List   Diagnosis Date Noted   Dyspnea on exertion 07/02/2018   Palpitations 07/02/2018   Mitral late systolic murmur 05/40/97/3532Cardiomegaly    Atypical chest pain 06/24/2018   Essential hypertension  06/10/2018   Hypoglycemia 03/27/2018   Pelvic pain in female 01/23/2011   Fibroid uterus 01/23/2011   Menorrhagia 01/23/2011    History obtained from: chart review and patient.  Rebecca Zamora was referred by MooMinette BrineNPMorovis  Rebecca Zamora is a 55 32o. female presenting for an evaluation of environmental allergies  and a rash with allergic reactions. She has been having problems since having COVID. She has had breaking out over her entire body. This was February 2022. She reports that she was having some problems with "choking". She took some allergy medication and it was not bad. She does not remember the name of the medication. She was fully vaccinated so it was not too bad. She reports that she was having a ton of allergies over her entire body. She reports a lot of itching.   She thinks this might be food related. She is unsure of what is triggering her symptoms. She had a lot of testing done by her PCP that showed multiple sensitivities as well as environmental allergies. Her food allergy testing (extensive IgE panel) was positive to cabbage, cauliflower, coconut, peach, pear, sesame seed, as well as cherry. She is avoiding all of these is very worried about her food intake. Vitamin D was normal. CMP was normal. Lipid panel were fairly normal. Environmental allergy panel was positive to trees and weeds. ANA was positive, but there is no titer. C3 was normal.   This rash she  describes as a sun burn like reaction. She has issues with the rash on her neck and face. She does report some issues with a sensation of throat closure. This was in the middle of the night. She took some pills from her PCP (unclear what these were) which helped. She denies that this was an antihistamine. She is wondering if she needs something to help when she has problems like that. This was only an issue at one time, but she is clearly worried about future episodes.   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    Otherwise, there is no history of other atopic diseases, including asthma, drug allergies, stinging insect allergies, eczema, urticaria, or contact dermatitis. There is no significant infectious history. Vaccinations are up to date.    Past Medical History:   Medication List:  Allergies as of 09/28/2020       Reactions   Nifedipine Other (See Comments)   Depression, nausea, fatigue   Hydrocodone Nausea Only        Medication List        Accurate as of September 28, 2020 11:59 PM. If you have any questions, ask your nurse or doctor.          STOP taking these medications    ASPIRIN 81 PO Stopped by: Valentina Shaggy, MD   cyclobenzaprine 10 MG tablet Commonly known as: FLEXERIL Stopped by: Valentina Shaggy, MD       TAKE these medications    albuterol 108 (90 Base) MCG/ACT inhaler Commonly known as: VENTOLIN HFA INHALE 2 PUFFS INTO THE LUNGS EVERY 6 (SIX) HOURS AS NEEDED FOR WHEEZING OR SHORTNESS OF BREATH.   amLODipine 5 MG tablet Commonly known as: NORVASC Take 1 tablet (5 mg total) by mouth daily.   EPINEPHrine 0.3 mg/0.3 mL Soaj injection Commonly known as: EPI-PEN Inject 0.3 mg into the muscle as needed for anaphylaxis. Started by: Valentina Shaggy, MD   Georga Hacking 2 % Oint Generic drug: Crisaborole Apply 1 application topically 2 (two) times daily as needed. OK to use on the face. Started by: Valentina Shaggy, MD   hydrocortisone cream 1 % Apply to affected area 2 times daily   mometasone 50 MCG/ACT nasal spray Commonly known as: NASONEX PLACE 2 SPRAYS INTO THE NOSE DAILY.   triamcinolone ointment 0.1 % Commonly known as: KENALOG Apply 1 application topically 2 (two) times daily as needed. Avoid the face. Started by: Valentina Shaggy, MD   VITAMIN D PO Take 2,000 mg by mouth daily.        Birth History: non-contributory  Developmental History: non-contributory  Past Surgical History: Past Surgical History:   Procedure Laterality Date   MYOMECTOMY  26     Family History: Family History  Problem Relation Age of Onset   Hypertension Mother    Stroke Father    Gout Father    Stroke Sister    Other Neg Hx    Diabetes Neg Hx      Social History: Rebecca Zamora lives at home with her family. There is no smoking exposure. There are no animals in the home.    Review of Systems  Constitutional: Negative.  Negative for fever, malaise/fatigue and weight loss.       Positive for throat closure.   HENT: Negative.  Negative for congestion, ear discharge and ear pain.   Eyes:  Negative for pain, discharge and redness.  Respiratory:  Negative for cough, sputum production, shortness  of breath and wheezing.   Cardiovascular: Negative.  Negative for chest pain and palpitations.  Gastrointestinal:  Negative for abdominal pain, heartburn and vomiting.  Skin:  Positive for itching and rash.  Neurological:  Negative for dizziness and headaches.  Endo/Heme/Allergies:  Negative for environmental allergies. Does not bruise/bleed easily.      Objective:   Blood pressure 120/82, pulse 76, temperature (!) 97.4 F (36.3 C), temperature source Temporal, height '5\' 2"'  (1.575 m), weight 152 lb 4 oz (69.1 kg), last menstrual period 01/21/2011, SpO2 99 %. Body mass index is 27.85 kg/m.   Physical Exam:   Physical Exam Vitals reviewed.  Constitutional:      Appearance: She is well-developed.     Comments: Somewhat anxious.  HENT:     Head: Normocephalic and atraumatic.     Right Ear: Tympanic membrane, ear canal and external ear normal. No drainage, swelling or tenderness. Tympanic membrane is not injected, scarred, erythematous, retracted or bulging.     Left Ear: Tympanic membrane, ear canal and external ear normal. No drainage, swelling or tenderness. Tympanic membrane is not injected, scarred, erythematous, retracted or bulging.     Nose: No nasal deformity, septal deviation, mucosal edema or rhinorrhea.      Right Sinus: No maxillary sinus tenderness or frontal sinus tenderness.     Left Sinus: No maxillary sinus tenderness or frontal sinus tenderness.     Mouth/Throat:     Mouth: Mucous membranes are not pale and not dry.     Pharynx: Uvula midline.  Eyes:     General:        Right eye: No discharge.        Left eye: No discharge.     Conjunctiva/sclera: Conjunctivae normal.     Right eye: Right conjunctiva is not injected. No chemosis.    Left eye: Left conjunctiva is not injected. No chemosis.    Pupils: Pupils are equal, round, and reactive to light.  Cardiovascular:     Rate and Rhythm: Normal rate and regular rhythm.     Heart sounds: Normal heart sounds.  Pulmonary:     Effort: Pulmonary effort is normal. No tachypnea, accessory muscle usage or respiratory distress.     Breath sounds: Normal breath sounds. No wheezing, rhonchi or rales.  Chest:     Chest wall: No tenderness.  Abdominal:     Tenderness: There is no abdominal tenderness. There is no guarding or rebound.  Lymphadenopathy:     Head:     Right side of head: No submandibular, tonsillar or occipital adenopathy.     Left side of head: No submandibular, tonsillar or occipital adenopathy.     Cervical: No cervical adenopathy.  Skin:    General: Skin is warm.     Capillary Refill: Capillary refill takes less than 2 seconds.     Coloration: Skin is not pale.     Findings: No abrasion, erythema, petechiae or rash. Rash is not papular, urticarial or vesicular.     Comments: No eczematous lesions noted.   Neurological:     Mental Status: She is alert.     Diagnostic studies:   Allergy Studies:      Intradermal - 09/28/20 1918     Time Antigen Placed 1918    Allergen Manufacturer Greer    Location Arm    Number of Test 13    Control Negative    [place] Negative    Johnson Negative    7 Grass 1+  Ragweed mix Negative    Mold 1 Negative    Mold 2 Negative    Mold 3 Negative    Mold 4 Negative    Cat  Negative    Dog 1+    Cockroach Negative    Mite mix Negative             Allergy testing results were read and interpreted by myself, documented by clinical staff.         Salvatore Marvel, MD [facility]

## 2020-09-28 NOTE — Patient Instructions (Addendum)
1. Chronic urticaria - We do not need to do more allergy testing since your NP did a lot of testing already.  - We will get some labs to rule out serious causes of hives: complete blood count, tryptase level, chronic urticaria panel, ESR, and CRP. - We are also going to look more into the ANA (which is a lab that can be positive with autoimmune disease) - I want to see this rash when it happens again.  - EpiPen training provided in case you need it.  - In the meantime, start suppressive dosing of antihistamines:   - Morning: Allegra (fexofenadine) 374m (two tablets)   - Evening: Zyrtec (cetirizine) 236m(two tablets)  - If the above is not working, try adding: Pepcid (famotidine) 2013m You can change this dosing at home, decreasing the dose as needed or increasing the dosing as needed.   2. Seasonal allergic rhinitis - Previous testing was positive to weeds and trees. - Testing today was to grasses and dog. - Copy of testing results provided. - The antihistamines should help.    3. Return in about 3 months (around 12/29/2020).    Please inform us [place] any Emergency Department visits, hospitalizations, or changes in symptoms. Call us [place] going to the ED for breathing or allergy symptoms since we might be able to fit you in for a sick visit. Feel free to contact us [place] with any questions, problems, or concerns.  It was a pleasure to meet you today!  Websites that have reliable patient information: 1. American Academy of Asthma, Allergy, and Immunology: www.aaaai.org 2. Food Allergy Research and Education (FARE): foodallergy.org 3. Mothers of Asthmatics: http://www.asthmacommunitynetwork.org 4. American College of Allergy, Asthma, and Immunology: www.acaai.org   COVID-19 Vaccine Information can be found at: [email or web address] questions related to vaccine distribution or appointments, please email [email or web address]  or call [phone] We realize that you might be concerned about having an allergic reaction to the COVID19 vaccines. To help with that concern, WE ARE OFFERING THE COVID19 VACCINES IN OUR OFFICE! Ask the front desk for dates!     "Like" us [place] Facebook and Instagram for our latest updates!      A healthy democracy works best when ALLNew York Life Insurancerticipate! Make sure you are registered to vote! If you have moved or changed any of your contact information, you will need to get this updated before voting!  In some cases, you MAY be able to register to vote online: [email or web address]

## 2020-09-29 ENCOUNTER — Encounter: Payer: Self-pay | Admitting: Allergy & Immunology

## 2020-09-29 ENCOUNTER — Other Ambulatory Visit (HOSPITAL_COMMUNITY): Payer: Self-pay

## 2020-10-06 LAB — TRYPTASE: Tryptase: 5 ug/L (ref 2.2–13.2)

## 2020-10-06 LAB — SEDIMENTATION RATE: Sed Rate: 6 mm/hr (ref 0–40)

## 2020-10-06 LAB — C-REACTIVE PROTEIN: CRP: 2 mg/L (ref 0–10)

## 2020-10-06 LAB — CHRONIC URTICARIA: cu index: 4.5 (ref <10)

## 2020-10-06 LAB — THYROID PANEL
Free Thyroxine Index: 2.2 (ref 1.2–4.9)
T3 Uptake Ratio: 28 % (ref 24–39)
T4, Total: 7.7 ug/dL (ref 4.5–12.0)

## 2020-10-06 LAB — ANTINUCLEAR ANTIBODIES, IFA: ANA Titer 1: NEGATIVE

## 2020-12-29 ENCOUNTER — Other Ambulatory Visit: Payer: Self-pay | Admitting: Nurse Practitioner

## 2020-12-29 ENCOUNTER — Other Ambulatory Visit (HOSPITAL_COMMUNITY): Payer: Self-pay

## 2020-12-30 ENCOUNTER — Other Ambulatory Visit (HOSPITAL_COMMUNITY): Payer: Self-pay

## 2020-12-31 ENCOUNTER — Other Ambulatory Visit (HOSPITAL_COMMUNITY): Payer: Self-pay

## 2020-12-31 MED ORDER — AMLODIPINE BESYLATE 5 MG PO TABS
5.0000 mg | ORAL_TABLET | Freq: Every day | ORAL | 0 refills | Status: DC
  Filled 2020-12-31: qty 90, 90d supply, fill #0

## 2021-01-12 ENCOUNTER — Other Ambulatory Visit: Payer: Self-pay | Admitting: Internal Medicine

## 2021-01-12 DIAGNOSIS — Z1231 Encounter for screening mammogram for malignant neoplasm of breast: Secondary | ICD-10-CM

## 2021-02-16 ENCOUNTER — Other Ambulatory Visit: Payer: Self-pay

## 2021-02-16 ENCOUNTER — Ambulatory Visit
Admission: RE | Admit: 2021-02-16 | Discharge: 2021-02-16 | Disposition: A | Discharge status: Home / Self Care | Payer: 59 | Source: Ambulatory Visit | Attending: Internal Medicine | Admitting: Internal Medicine

## 2021-02-16 DIAGNOSIS — Z1231 Encounter for screening mammogram for malignant neoplasm of breast: Secondary | ICD-10-CM

## 2021-04-16 IMAGING — MG DIGITAL SCREENING BILAT W/ TOMO W/ CAD
8 series · 9 of 24 positions shown · non-contrast
Comparison: Previous exam(s).

CLINICAL DATA: Screening.

EXAM:
DIGITAL SCREENING BILATERAL MAMMOGRAM WITH TOMO AND CAD

[L MLO synth-2D]
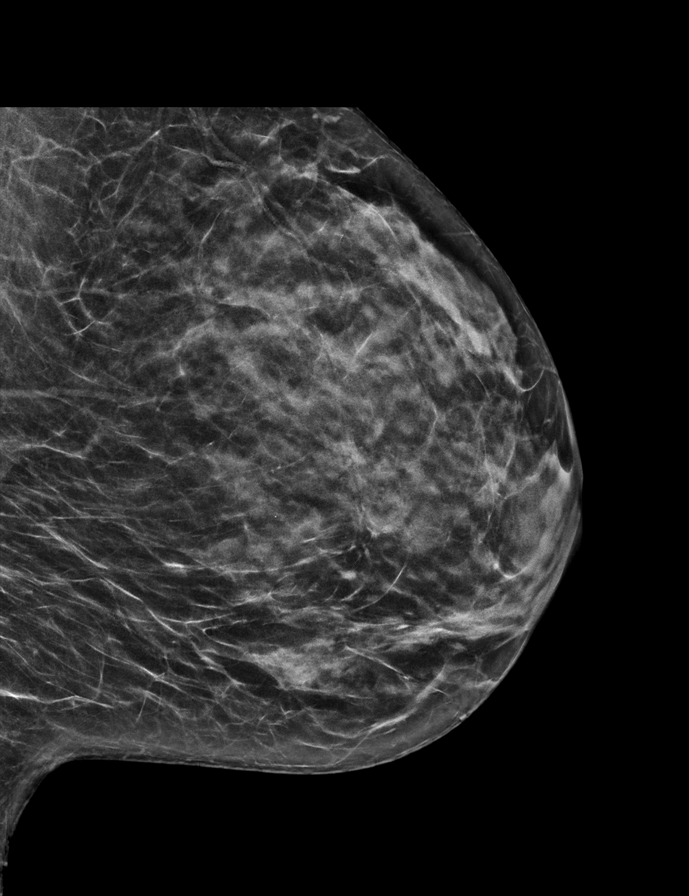

[R CC synth-2D]
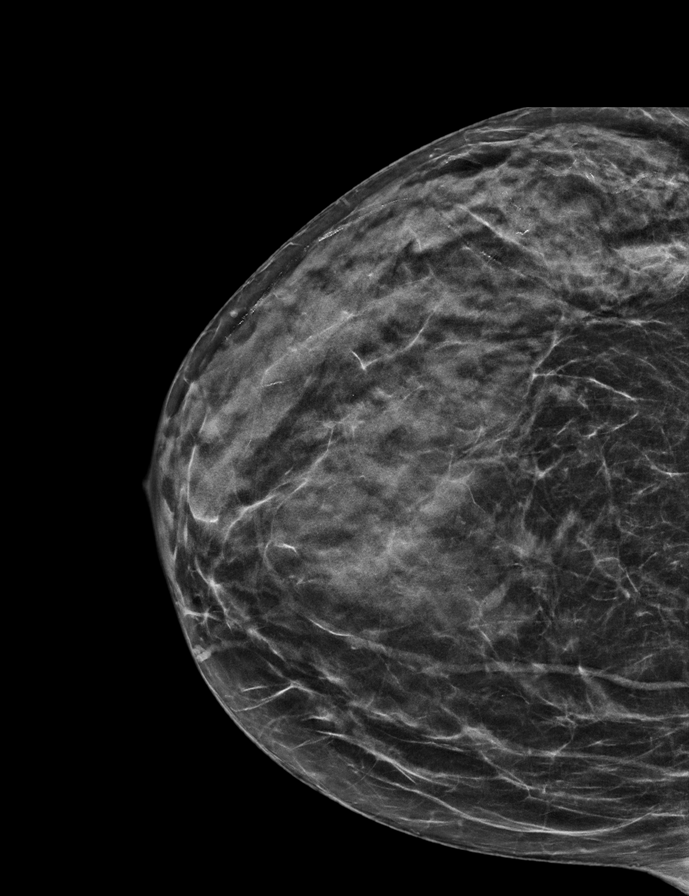

[R MLO synth-2D]
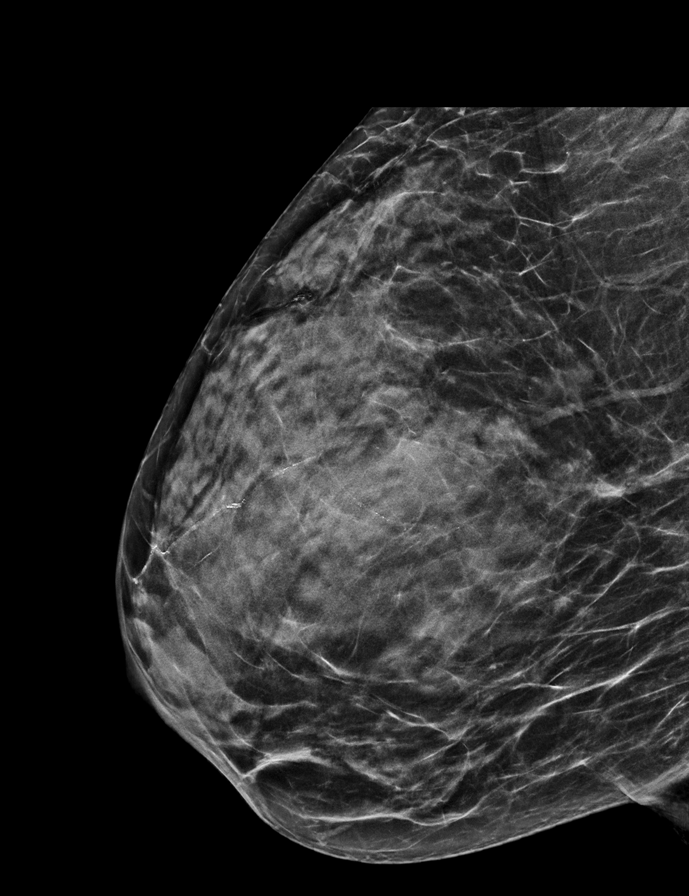

[L CC synth-2D]
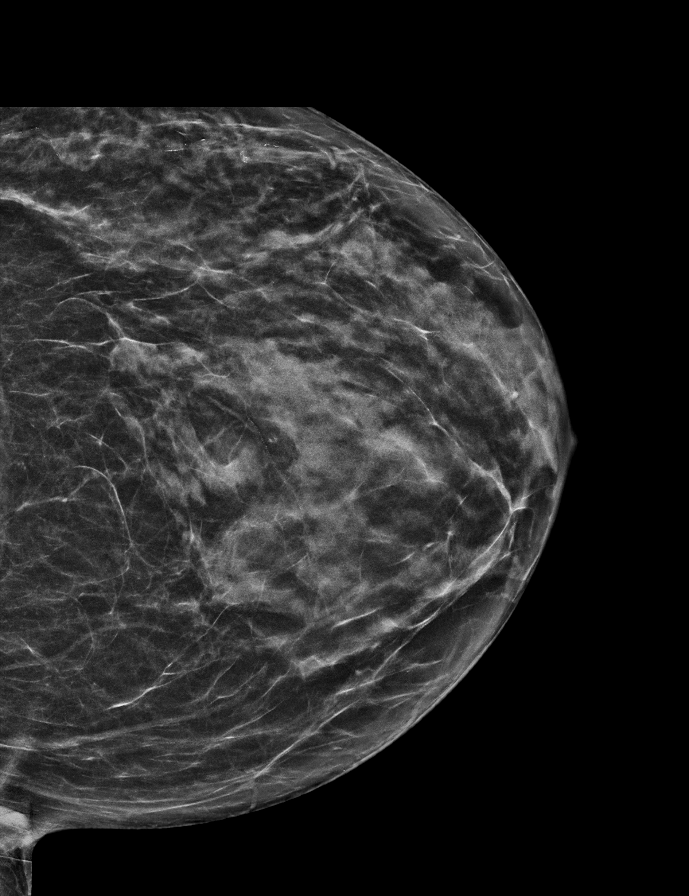

[R CC tomo · 2 of 50 frames shown]
[frame 17/50]
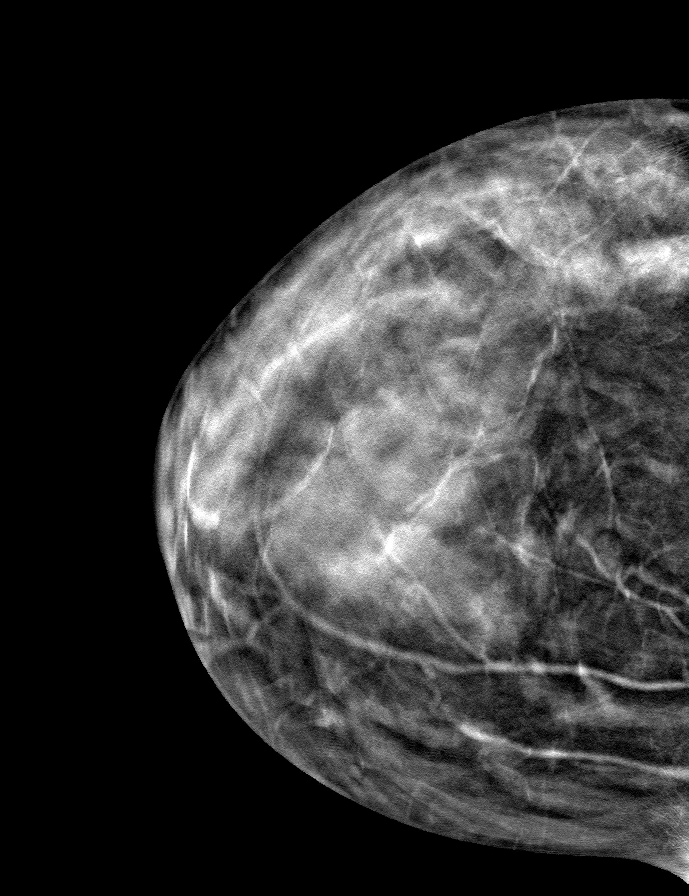
[frame 25/50]
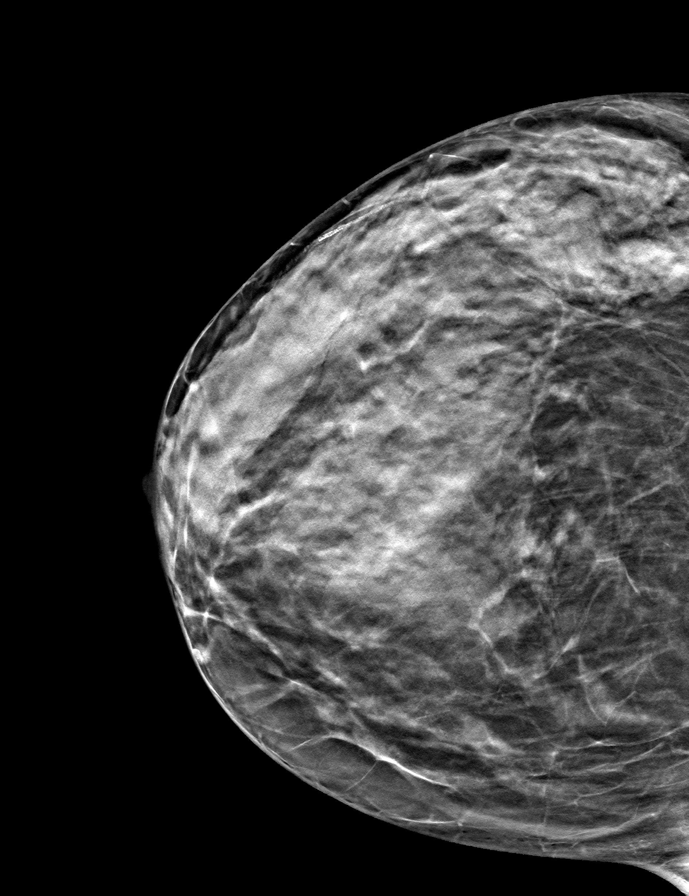

[R MLO tomo · tomo slice 25/50.0]
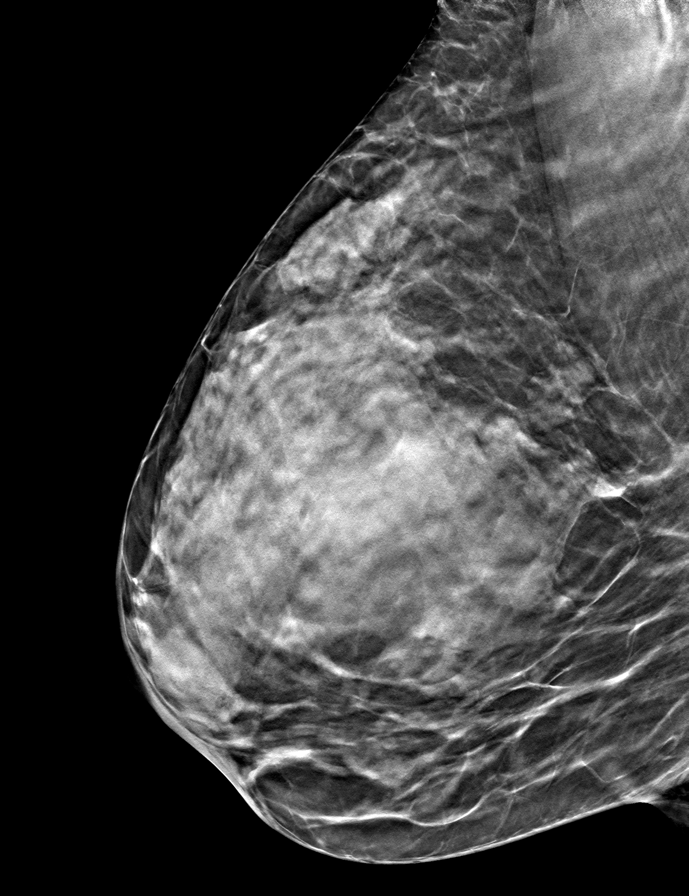

[L CC tomo · tomo slice 26/51.0]
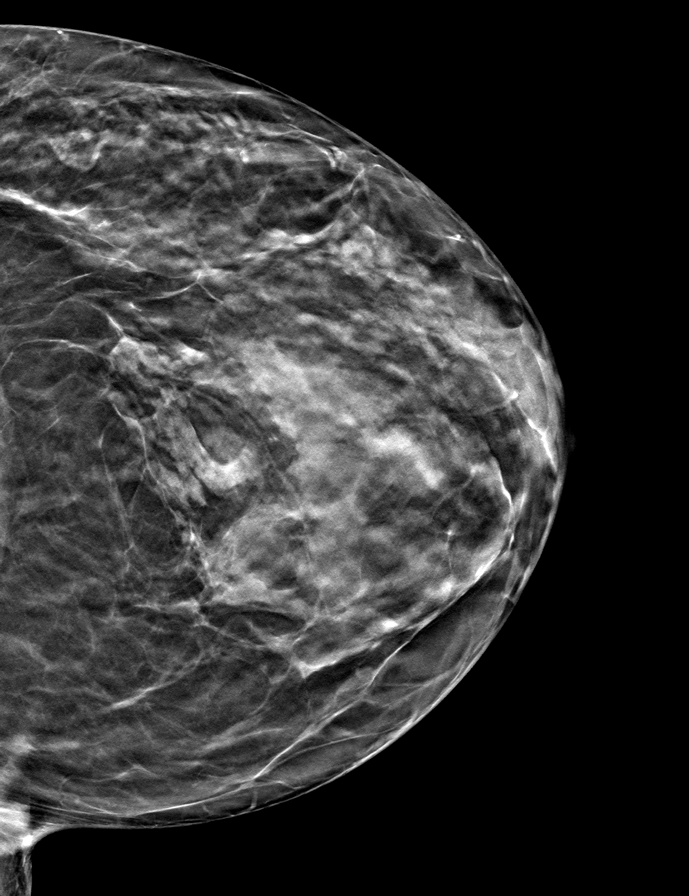

[L MLO tomo · tomo slice 26/51.0]
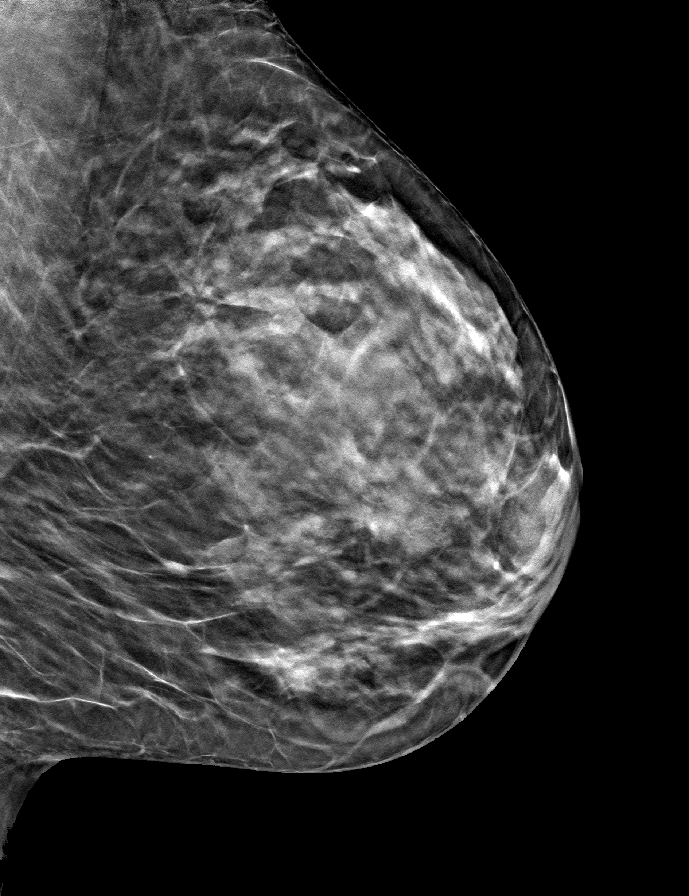

[9 of 24 positions shown; findings below may reference images not displayed]

ACR Breast Density Category d: The breast tissue is extremely dense,
which lowers the sensitivity of mammography
FINDINGS: There are no findings suspicious for malignancy. Images were
processed with CAD.
IMPRESSION: No mammographic evidence of malignancy. A result letter of this
screening mammogram will be mailed directly to the patient.

RECOMMENDATION:
Screening mammogram in one year. (Code:WO-[id number])

BI-RADS CATEGORY  1: Negative.

## 2021-05-23 ENCOUNTER — Other Ambulatory Visit: Payer: Self-pay | Admitting: Nurse Practitioner

## 2021-05-23 ENCOUNTER — Other Ambulatory Visit: Payer: Self-pay

## 2021-05-23 ENCOUNTER — Other Ambulatory Visit (HOSPITAL_COMMUNITY): Payer: Self-pay

## 2021-05-23 MED ORDER — AMLODIPINE BESYLATE 5 MG PO TABS
5.0000 mg | ORAL_TABLET | Freq: Every day | ORAL | 0 refills | Status: DC
Start: 2021-05-23 — End: 2021-06-16
  Filled 2021-05-23: qty 30, 30d supply, fill #0

## 2021-06-16 ENCOUNTER — Encounter: Payer: 59 | Admitting: Nurse Practitioner

## 2021-06-16 ENCOUNTER — Ambulatory Visit (INDEPENDENT_AMBULATORY_CARE_PROVIDER_SITE_OTHER): Payer: 59 | Admitting: Nurse Practitioner

## 2021-06-16 ENCOUNTER — Other Ambulatory Visit (HOSPITAL_COMMUNITY)
Admission: RE | Admit: 2021-06-16 | Discharge: 2021-06-16 | Disposition: A | Discharge status: Home / Self Care | Payer: 59 | Source: Ambulatory Visit | Attending: Nurse Practitioner | Admitting: Nurse Practitioner

## 2021-06-16 ENCOUNTER — Other Ambulatory Visit (HOSPITAL_COMMUNITY): Payer: Self-pay

## 2021-06-16 ENCOUNTER — Encounter: Payer: Self-pay | Admitting: Nurse Practitioner

## 2021-06-16 VITALS — BP 132/78 | HR 59 | Temp 97.5°F | Ht 62.0 in | Wt 152.0 lb

## 2021-06-16 DIAGNOSIS — M2559 Pain in other specified joint: Secondary | ICD-10-CM | POA: Diagnosis not present

## 2021-06-16 DIAGNOSIS — M62838 Other muscle spasm: Secondary | ICD-10-CM

## 2021-06-16 DIAGNOSIS — Z0000 Encounter for general adult medical examination without abnormal findings: Secondary | ICD-10-CM | POA: Diagnosis not present

## 2021-06-16 DIAGNOSIS — Z124 Encounter for screening for malignant neoplasm of cervix: Secondary | ICD-10-CM

## 2021-06-16 DIAGNOSIS — Z79899 Other long term (current) drug therapy: Secondary | ICD-10-CM | POA: Diagnosis not present

## 2021-06-16 DIAGNOSIS — I10 Essential (primary) hypertension: Secondary | ICD-10-CM | POA: Diagnosis not present

## 2021-06-16 DIAGNOSIS — Z23 Encounter for immunization: Secondary | ICD-10-CM | POA: Diagnosis not present

## 2021-06-16 LAB — POCT URINALYSIS DIPSTICK
Bilirubin, UA: NEGATIVE
Blood, UA: NEGATIVE
Glucose, UA: NEGATIVE
Ketones, UA: NEGATIVE
Leukocytes, UA: NEGATIVE
Nitrite, UA: NEGATIVE
Protein, UA: NEGATIVE
Spec Grav, UA: <=1.005 — AB (ref 1.010–1.025)
Urobilinogen, UA: 0.2 E.U./dL
pH, UA: 5.5 (ref 5.0–8.0)

## 2021-06-16 MED ORDER — CYCLOBENZAPRINE HCL 10 MG PO TABS
10.0000 mg | ORAL_TABLET | Freq: Three times a day (TID) | ORAL | 1 refills | Status: DC | PRN
  Filled 2021-06-16: qty 30, 10d supply, fill #0

## 2021-06-16 MED ORDER — AMLODIPINE BESYLATE 5 MG PO TABS
5.0000 mg | ORAL_TABLET | Freq: Every day | ORAL | 1 refills | Status: DC
Start: 2021-06-16 — End: 2021-11-16
  Filled 2021-06-16: qty 90, 90d supply, fill #0
  Filled 2021-09-15: qty 90, 90d supply, fill #1

## 2021-06-16 NOTE — Progress Notes (Signed)
?Industrial/product designer as a Education administrator for [employer], FNP.,have documented all relevant documentation on the behalf of Minette Brine, FNP,as directed by  Minette Brine, FNP while in the presence of Minette Brine, [place]. ? ?This visit occurred during the SARS-CoV-2 public health emergency.  Safety protocols were in place, including screening questions prior to the visit, additional usage of staff PPE, and extensive cleaning of exam room while observing appropriate contact time as indicated for disinfecting solutions. ? ?Subjective:  ?  ? Patient ID: Rebecca Zamora , female    DOB: December 17, 1965 , 56 y.o.   MRN: [id number] ? ? ?Chief Complaint  ?Patient presents with  ? Annual Exam  ? ? ?HPI ? ?Patient presents today for her physical. She has been released from Cardiology (Dr Einar Gip) with her last visit.  ? ?Hypertension ?This is a chronic problem. The current episode started more than 1 year ago. The problem is controlled. Pertinent negatives include no anxiety.   ? ?Past Medical History:  ?Diagnosis Date  ? Cardiomegaly   ? HTN (hypertension)   ? No pertinent past medical history   ?  ? ?Family History  ?Problem Relation Age of Onset  ? Hypertension Mother   ? Stroke Father   ? Gout Father   ? Stroke Sister   ? Other Neg Hx   ? Diabetes Neg Hx   ? ? ? ?Current Outpatient Medications:  ?  EPINEPHrine 0.3 mg/0.3 mL IJ SOAJ injection, Inject 0.3 mg into the muscle as needed for anaphylaxis., Disp: 2 each, Rfl: 1 ?  triamcinolone ointment (KENALOG) 0.1 %, Apply 1 application topically 2 (two) times daily as needed. Avoid the face., Disp: 498 g, Rfl: 1 ?  amLODipine (NORVASC) 5 MG tablet, Take 1 tablet (5 mg total) by mouth daily., Disp: 90 tablet, Rfl: 1 ?  cyclobenzaprine (FLEXERIL) 10 MG tablet, Take 1 tablet (10 mg total) by mouth 3 (three) times daily as needed for muscle spasms., Disp: 30 tablet, Rfl: 1  ? ?Allergies  ?Allergen Reactions  ? Nifedipine Other (See Comments)  ?  Depression, nausea, fatigue  ? Hydrocodone  Nausea Only  ?  ? ? ?The patient states she is status post hysterectomy.  Patient's last menstrual period was 01/21/2011.. Negative for: breast discharge, breast lump(s), breast pain and breast self exam. Associated symptoms include abnormal vaginal bleeding. Pertinent negatives include abnormal bleeding (hematology), anxiety, decreased libido, depression, difficulty falling sleep, dyspareunia, history of infertility, nocturia, sexual dysfunction, sleep disturbances, urinary incontinence, urinary urgency, vaginal discharge and vaginal itching. Diet regular. The patient states her exercise level is moderate - she is exercising daily, 45 minutes to 1 hour each day.  ? ?The patient's tobacco use is:  ?Social History  ? ?Tobacco Use  ?Smoking Status Never  ?Smokeless Tobacco Never  ? ?She has been exposed to passive smoke. The patient's alcohol use is:  ?Social History  ? ?Substance and Sexual Activity  ?Alcohol Use Not Currently  ? ?  ? ?Review of Systems  ?Constitutional: Negative.   ?HENT: Negative.    ?Eyes: Negative.   ?Respiratory: Negative.    ?Cardiovascular: Negative.   ?Gastrointestinal: Negative.   ?Endocrine: Negative.   ?Genitourinary: Negative.   ?Musculoskeletal: Negative.   ?Skin: Negative.   ?Allergic/Immunologic: Negative.   ?Neurological: Negative.   ?Hematological: Negative.   ?Psychiatric/Behavioral: Negative.     ? ?Today's Vitals  ? 06/16/21 1415  ?BP: 132/78  ?Pulse: (!) 59  ?Temp: (!) 97.5 ?F (36.4 ?C)  ?TempSrc: Oral  ?  Weight: 152 lb (68.9 kg)  ?Height: '5\' 2"'  (1.575 m)  ? ?Body mass index is 27.8 kg/m?.  ?Wt Readings from Last 3 Encounters:  ?06/16/21 152 lb (68.9 kg)  ?09/28/20 152 lb 4 oz (69.1 kg)  ?06/15/20 143 lb (64.9 kg)  ? ? ?Objective:  ?Physical Exam ?Vitals reviewed. Exam conducted with a chaperone present.  ?Constitutional:   ?   General: She is not in acute distress. ?   Appearance: Normal appearance. She is well-developed. She is obese.  ?HENT:  ?   Head: Normocephalic and  atraumatic.  ?   Right Ear: Hearing, tympanic membrane, ear canal and external ear normal. There is no impacted cerumen.  ?   Left Ear: Hearing, tympanic membrane, ear canal and external ear normal. There is no impacted cerumen.  ?   Nose:  ?   Comments: Deferred - masked ?   Mouth/Throat:  ?   Comments: Deferred - masked ?Eyes:  ?   General: Lids are normal.  ?   Extraocular Movements: Extraocular movements intact.  ?   Conjunctiva/sclera: Conjunctivae normal.  ?   Pupils: Pupils are equal, round, and reactive to light.  ?   Funduscopic exam: ?   Right eye: No papilledema.     ?   Left eye: No papilledema.  ?Neck:  ?   Thyroid: No thyroid mass.  ?   Vascular: No carotid bruit.  ?Cardiovascular:  ?   Rate and Rhythm: Normal rate and regular rhythm.  ?   Pulses: Normal pulses.  ?   Heart sounds: Normal heart sounds. No murmur heard. ?Pulmonary:  ?   Effort: Pulmonary effort is normal. No respiratory distress.  ?   Breath sounds: Normal breath sounds. No wheezing.  ?Abdominal:  ?   General: Abdomen is flat. Bowel sounds are normal. There is no distension.  ?   Palpations: Abdomen is soft.  ?   Hernia: There is no hernia in the left inguinal area or right inguinal area.  ?Genitourinary: ?   General: Normal vulva.  ?   Pubic Area: No rash.   ?   Labia:     ?   Right: No rash or tenderness.     ?   Left: No rash or tenderness.   ?   Vagina: Normal. No vaginal discharge.  ?   Cervix: Normal.  ?   Uterus: Absent.   ?   Rectum: Normal. Guaiac result negative.  ?Musculoskeletal:     ?   General: No swelling. Normal range of motion.  ?   Cervical back: Full passive range of motion without pain, normal range of motion and neck supple. No rigidity.  ?   Right lower leg: No edema.  ?   Left lower leg: No edema.  ?Skin: ?   General: Skin is warm and dry.  ?   Capillary Refill: Capillary refill takes less than 2 seconds.  ?Neurological:  ?   General: No focal deficit present.  ?   Mental Status: She is alert and oriented to  person, place, and time.  ?   Cranial Nerves: No cranial nerve deficit.  ?   Sensory: No sensory deficit.  ?   Motor: No weakness.  ?Psychiatric:     ?   Mood and Affect: Mood normal.     ?   Behavior: Behavior normal.     ?   Thought Content: Thought content normal.     ?   Judgment: Judgment  normal.  ?  ? ?   ?Assessment And Plan:  ?   ?1. Encounter for general adult medical examination w/o abnormal findings ?Behavior modifications discussed and diet history reviewed.   ?Pt will continue to exercise regularly and modify diet with low GI, plant based foods and decrease intake of processed foods.  ?Recommend intake of daily multivitamin, Vitamin D, and calcium.  ?Recommend mammogram and colonoscopy for preventive screenings, as well as recommend immunizations that include influenza, TDAP, and Shingles ? ?2. Essential hypertension ?Comments: Blood pressure is fairly controlled, continue current medications ?- POCT Urinalysis Dipstick (51700) ?- Microalbumin / Creatinine Urine Ratio ?- EKG 12-Lead ?- CMP14+EGFR ?- Lipid panel ?- amLODipine (NORVASC) 5 MG tablet; Take 1 tablet (5 mg total) by mouth daily.  Dispense: 90 tablet; Refill: 1 ? ?3. Pain in other joint ?Comments: Will check autoimmune panel. Encouraged to limit intake of gluten products ?- Autoimmune Profile ? ?4. Muscle spasm ?- cyclobenzaprine (FLEXERIL) 10 MG tablet; Take 1 tablet (10 mg total) by mouth 3 (three) times daily as needed for muscle spasms.  Dispense: 30 tablet; Refill: 1 ? ?5. Encounter for immunization ?Shingrix #1 given ?- Varicella-zoster vaccine IM ? ?6. Encounter for Papanicolaou smear of cervix ?Comments: No abnormal findings on physical exam  ?- Cytology -Pap Smear ? ?7. Other long term (current) drug therapy ?- CBC ? ? ?Patient was given opportunity to ask questions. Patient verbalized understanding of the plan and was able to repeat key elements of the plan. All questions were answered to their satisfaction.  ? ?Minette Brine, FNP   ? ?I, Minette Brine, FNP, have reviewed all documentation for this visit. The documentation on 06/16/21 for the exam, diagnosis, procedures, and orders are all accurate and complete.  ? ?THE PATIENT IS ENCOURAGED TO PRA

## 2021-06-16 NOTE — Patient Instructions (Signed)

## 2021-06-17 LAB — AUTOIMMUNE PROFILE
Anti Nuclear Antibody (ANA): POSITIVE — AB
Complement C3, Serum: 145 mg/dL (ref 82–167)
dsDNA Ab: <1 IU/mL (ref 0–9)

## 2021-06-17 LAB — CBC
Hematocrit: 42.4 % (ref 34.0–46.6)
Hemoglobin: 14.4 g/dL (ref 11.1–15.9)
MCH: 29.3 pg (ref 26.6–33.0)
MCHC: 34 g/dL (ref 31.5–35.7)
MCV: 86 fL (ref 79–97)
Platelets: 269 10*3/uL (ref 150–450)
RBC: 4.91 x10E6/uL (ref 3.77–5.28)
RDW: 14 % (ref 11.7–15.4)
WBC: 4.3 10*3/uL (ref 3.4–10.8)

## 2021-06-17 LAB — MICROALBUMIN / CREATININE URINE RATIO
Creatinine, Urine: 25.8 mg/dL
Microalb/Creat Ratio: <12 mg/g creat (ref 0–29)
Microalbumin, Urine: <3 ug/mL

## 2021-06-17 LAB — CMP14+EGFR
ALT: 19 IU/L (ref 0–32)
AST: 33 IU/L (ref 0–40)
Albumin/Globulin Ratio: 1.7 (ref 1.2–2.2)
Albumin: 4.7 g/dL (ref 3.8–4.9)
Alkaline Phosphatase: 110 IU/L (ref 44–121)
BUN/Creatinine Ratio: 14 (ref 9–23)
BUN: 15 mg/dL (ref 6–24)
Bilirubin Total: 0.6 mg/dL (ref 0.0–1.2)
CO2: 25 mmol/L (ref 20–29)
Calcium: 9.4 mg/dL (ref 8.7–10.2)
Chloride: 105 mmol/L (ref 96–106)
Creatinine, Ser: 1.05 mg/dL — ABNORMAL HIGH (ref 0.57–1.00)
Globulin, Total: 2.7 g/dL (ref 1.5–4.5)
Glucose: 73 mg/dL (ref 70–99)
Potassium: 3.7 mmol/L (ref 3.5–5.2)
Sodium: 144 mmol/L (ref 134–144)
Total Protein: 7.4 g/dL (ref 6.0–8.5)
eGFR: 63 mL/min/{1.73_m2} (ref >59)

## 2021-06-17 LAB — LIPID PANEL
Chol/HDL Ratio: 2.1 ratio (ref 0.0–4.4)
Cholesterol, Total: 199 mg/dL (ref 100–199)
HDL: 95 mg/dL (ref >39)
LDL Chol Calc (NIH): 95 mg/dL (ref 0–99)
Triglycerides: 46 mg/dL (ref 0–149)
VLDL Cholesterol Cal: 9 mg/dL (ref 5–40)

## 2021-06-20 ENCOUNTER — Other Ambulatory Visit: Payer: Self-pay | Admitting: Nurse Practitioner

## 2021-06-20 DIAGNOSIS — R768 Other specified abnormal immunological findings in serum: Secondary | ICD-10-CM

## 2021-06-20 DIAGNOSIS — M2559 Pain in other specified joint: Secondary | ICD-10-CM

## 2021-06-21 LAB — CYTOLOGY - PAP: Diagnosis: NEGATIVE

## 2021-07-11 ENCOUNTER — Other Ambulatory Visit (HOSPITAL_COMMUNITY): Payer: Self-pay

## 2021-07-11 MED ORDER — OMRON 3 SERIES BP MONITOR DEVI
0 refills | Status: DC
  Filled 2021-07-11: qty 1, 1d supply, fill #0

## 2021-08-16 ENCOUNTER — Ambulatory Visit: Payer: 59

## 2021-08-16 ENCOUNTER — Other Ambulatory Visit (HOSPITAL_COMMUNITY): Payer: Self-pay

## 2021-08-16 MED ORDER — ZOSTER VAC RECOMB ADJUVANTED 50 MCG/0.5ML IM SUSR
INTRAMUSCULAR | 1 refills | Status: DC
  Filled 2021-08-16: qty 0.5, 1d supply, fill #0

## 2021-08-17 ENCOUNTER — Other Ambulatory Visit (HOSPITAL_COMMUNITY): Payer: Self-pay

## 2021-08-17 MED ORDER — ZOSTER VAC RECOMB ADJUVANTED 50 MCG/0.5ML IM SUSR
0.5000 mL | Freq: Every day | INTRAMUSCULAR | 0 refills | Status: DC
  Filled 2021-08-17: qty 0.5, 1d supply, fill #0

## 2021-08-30 ENCOUNTER — Ambulatory Visit: Payer: 59

## 2021-09-15 ENCOUNTER — Other Ambulatory Visit (HOSPITAL_COMMUNITY): Payer: Self-pay

## 2021-10-18 ENCOUNTER — Other Ambulatory Visit (HOSPITAL_COMMUNITY): Payer: Self-pay

## 2021-11-09 ENCOUNTER — Other Ambulatory Visit (HOSPITAL_COMMUNITY): Payer: Self-pay

## 2021-11-09 ENCOUNTER — Other Ambulatory Visit: Payer: Self-pay | Admitting: Nurse Practitioner

## 2021-11-09 DIAGNOSIS — I10 Essential (primary) hypertension: Secondary | ICD-10-CM

## 2021-11-10 ENCOUNTER — Other Ambulatory Visit (HOSPITAL_COMMUNITY): Payer: Self-pay

## 2021-11-16 ENCOUNTER — Other Ambulatory Visit (HOSPITAL_COMMUNITY): Payer: Self-pay

## 2021-11-16 ENCOUNTER — Encounter: Payer: Self-pay | Admitting: Nurse Practitioner

## 2021-11-16 ENCOUNTER — Ambulatory Visit: Payer: 59 | Admitting: Nurse Practitioner

## 2021-11-16 VITALS — BP 108/86 | HR 76 | Temp 98.0°F | Ht 62.0 in | Wt 153.8 lb

## 2021-11-16 DIAGNOSIS — I10 Essential (primary) hypertension: Secondary | ICD-10-CM | POA: Diagnosis not present

## 2021-11-16 DIAGNOSIS — M62838 Other muscle spasm: Secondary | ICD-10-CM

## 2021-11-16 DIAGNOSIS — R051 Acute cough: Secondary | ICD-10-CM | POA: Diagnosis not present

## 2021-11-16 MED ORDER — BENZONATATE 100 MG PO CAPS
100.0000 mg | ORAL_CAPSULE | Freq: Four times a day (QID) | ORAL | 1 refills | Status: DC | PRN
  Filled 2021-11-16: qty 30, 8d supply, fill #0
  Filled 2021-12-29: qty 30, 8d supply, fill #1

## 2021-11-16 MED ORDER — CYCLOBENZAPRINE HCL 10 MG PO TABS
10.0000 mg | ORAL_TABLET | Freq: Three times a day (TID) | ORAL | 1 refills | Status: DC | PRN
  Filled 2021-11-16: qty 30, 10d supply, fill #0

## 2021-11-16 MED ORDER — AMLODIPINE BESYLATE 5 MG PO TABS
5.0000 mg | ORAL_TABLET | Freq: Every day | ORAL | 1 refills | Status: DC
  Filled 2021-11-16 – 2021-11-29 (×2): qty 90, 90d supply, fill #0
  Filled 2022-02-24: qty 90, 90d supply, fill #1

## 2021-11-16 NOTE — Progress Notes (Signed)
I,Jahkari Maclin,acting as a Education administrator for Minette Brine, FNP.,have documented all relevant documentation on the behalf of Minette Brine, FNP,as directed by  Minette Brine, FNP while in the presence of Minette Brine, [place].    Subjective:     Patient ID: Rebecca Zamora , female    DOB: 1965-03-10 , 56 y.o.   MRN: [id number]   Chief Complaint  Patient presents with   Hypertension    HPI  Patient presents today for a bp check. She has an appt with Rheumatology in November. She has been also using garlic and vinegar when she was out of her medications  BP Readings from Last 3 Encounters: 11/16/21 : 108/86 06/16/21 : 132/78 09/28/20 : 120/82       Past Medical History:  Diagnosis Date   Cardiomegaly    HTN (hypertension)    No pertinent past medical history      Family History  Problem Relation Age of Onset   Hypertension Mother    Stroke Father    Gout Father    Stroke Sister    Other Neg Hx    Diabetes Neg Hx      Current Outpatient Medications:    benzonatate (TESSALON PERLES) 100 MG capsule, Take 1 capsule (100 mg total) by mouth every 6 (six) hours as needed., Disp: 30 capsule, Rfl: 1   Blood Pressure Monitoring (OMRON 3 SERIES BP MONITOR) DEVI, use as directed, Disp: 1 each, Rfl: 0   EPINEPHrine 0.3 mg/0.3 mL IJ SOAJ injection, Inject 0.3 mg into the muscle as needed for anaphylaxis., Disp: 2 each, Rfl: 1   triamcinolone ointment (KENALOG) 0.1 %, Apply 1 application topically 2 (two) times daily as needed. Avoid the face., Disp: 416 g, Rfl: 1   amLODipine (NORVASC) 5 MG tablet, Take 1 tablet (5 mg total) by mouth daily., Disp: 90 tablet, Rfl: 1   cyclobenzaprine (FLEXERIL) 10 MG tablet, Take 1 tablet (10 mg total) by mouth 3 (three) times daily as needed for muscle spasms., Disp: 30 tablet, Rfl: 1   Allergies  Allergen Reactions   Nifedipine Other (See Comments)    Depression, nausea, fatigue   Hydrocodone Nausea Only     Review of Systems  Constitutional:  Negative.   HENT: Negative.    Eyes: Negative.   Respiratory:  Positive for cough (4 day history of cough worse at night - she has taken zyrtec but has not been effective. She has not checked Covid.). Negative for chest tightness and shortness of breath.   Cardiovascular: Negative.  Negative for chest pain.  Gastrointestinal: Negative.   Psychiatric/Behavioral: Negative.       Today's Vitals   11/16/21 1533  BP: 108/86  Pulse: 76  Temp: 98 F (36.7 C)  TempSrc: Oral  Weight: 153 lb 12.8 oz (69.8 kg)  Height: _0  (1.575 m)  PainSc: 0-No pain   Body mass index is 28.13 kg/m.  Wt Readings from Last 3 Encounters:  11/16/21 153 lb 12.8 oz (69.8 kg)  06/16/21 152 lb (68.9 kg)  09/28/20 152 lb 4 oz (69.1 kg)     Objective:  Physical Exam Vitals reviewed.  Constitutional:      General: She is not in acute distress.    Appearance: Normal appearance. She is well-developed.  HENT:     Head: Normocephalic and atraumatic.     Right Ear: Hearing normal. There is no impacted cerumen.     Left Ear: Hearing normal. There is no impacted cerumen.  Eyes:  General: Lids are normal.     Extraocular Movements: Extraocular movements intact.     Conjunctiva/sclera: Conjunctivae normal.     Pupils: Pupils are equal, round, and reactive to light.     Funduscopic exam:    Right eye: No papilledema.        Left eye: No papilledema.  Neck:     Thyroid: No thyroid mass.     Vascular: No carotid bruit.     Comments: Neck tension bilaterally Cardiovascular:     Rate and Rhythm: Normal rate and regular rhythm.     Pulses: Normal pulses.     Heart sounds: Normal heart sounds. No murmur heard. Pulmonary:     Effort: Pulmonary effort is normal. No respiratory distress.     Breath sounds: Normal breath sounds. No wheezing.  Chest:  Breasts:    Breasts are symmetrical.     Right: Normal. No mass or nipple discharge.     Left: Normal. No mass or nipple discharge.  Genitourinary:    Pubic  Area: No rash.      Labia:        Right: No tenderness.        Left: No tenderness.      Vagina: Normal.     Cervix: Normal.     Uterus: Normal.      Adnexa: Right adnexa normal and left adnexa normal.  Musculoskeletal:        General: No swelling.     Cervical back: Full passive range of motion without pain.     Right lower leg: No edema.     Left lower leg: No edema.  Lymphadenopathy:     Upper Body:     Right upper body: No supraclavicular or axillary adenopathy.     Left upper body: No supraclavicular or axillary adenopathy.  Skin:    General: Skin is warm and dry.     Capillary Refill: Capillary refill takes less than 2 seconds.  Neurological:     General: No focal deficit present.     Mental Status: She is alert and oriented to person, place, and time.     Cranial Nerves: No cranial nerve deficit.     Sensory: No sensory deficit.     Motor: No weakness.  Psychiatric:        Mood and Affect: Mood normal.        Behavior: Behavior normal.        Thought Content: Thought content normal.        Judgment: Judgment normal.         Assessment And Plan:     1. Essential hypertension Comments: Blood pressure is fairly controlled. Continue current medications. Discussed importance of f/u at least every 3-4 months or 6 months when controlled.  - BMP8+eGFR - amLODipine (NORVASC) 5 MG tablet; Take 1 tablet (5 mg total) by mouth daily.  Dispense: 90 tablet; Refill: 1  2. Muscle spasm Comments: Refill for cyclobenzaprine sent - cyclobenzaprine (FLEXERIL) 10 MG tablet; Take 1 tablet (10 mg total) by mouth 3 (three) times daily as needed for muscle spasms.  Dispense: 30 tablet; Refill: 1  3. Acute cough Comments: Has been ongoing for few weeks affecting sleep, will send Tessalon perles - benzonatate (TESSALON PERLES) 100 MG capsule; Take 1 capsule (100 mg total) by mouth every 6 (six) hours as needed.  Dispense: 30 capsule; Refill: 1     Patient was given opportunity to ask  questions. Patient verbalized understanding of the  plan and was able to repeat key elements of the plan. All questions were answered to their satisfaction.  Minette Brine, FNP   I, Minette Brine, FNP, have reviewed all documentation for this visit. The documentation on 11/18/21 for the exam, diagnosis, procedures, and orders are all accurate and complete.   IF YOU HAVE BEEN REFERRED TO A SPECIALIST, IT MAY TAKE 1-2 WEEKS TO SCHEDULE/PROCESS THE REFERRAL. IF YOU HAVE NOT HEARD FROM US/SPECIALIST IN TWO WEEKS, PLEASE GIVE [place] A CALL AT [phone].   THE PATIENT IS ENCOURAGED TO PRACTICE SOCIAL DISTANCING DUE TO THE COVID-19 PANDEMIC.

## 2021-11-16 NOTE — Patient Instructions (Signed)
Hypertension, Adult ?Hypertension is another name for high blood pressure. High blood pressure forces your heart to work harder to pump blood. This can cause problems over time. ?There are two numbers in a blood pressure reading. There is a top number (systolic) over a bottom number (diastolic). It is best to have a blood pressure that is below 120/80. ?What are the causes? ?The cause of this condition is not known. Some other conditions can lead to high blood pressure. ?What increases the risk? ?Some lifestyle factors can make you more likely to develop high blood pressure: ?Smoking. ?Not getting enough exercise or physical activity. ?Being overweight. ?Having too much fat, sugar, calories, or salt (sodium) in your diet. ?Drinking too much alcohol. ?Other risk factors include: ?Having any of these conditions: ?Heart disease. ?Diabetes. ?High cholesterol. ?Kidney disease. ?Obstructive sleep apnea. ?Having a family history of high blood pressure and high cholesterol. ?Age. The risk increases with age. ?Stress. ?What are the signs or symptoms? ?High blood pressure may not cause symptoms. Very high blood pressure (hypertensive crisis) may cause: ?Headache. ?Fast or uneven heartbeats (palpitations). ?Shortness of breath. ?Nosebleed. ?Vomiting or feeling like you may vomit (nauseous). ?Changes in how you see. ?Very bad chest pain. ?Feeling dizzy. ?Seizures. ?How is this treated? ?This condition is treated by making healthy lifestyle changes, such as: ?Eating healthy foods. ?Exercising more. ?Drinking less alcohol. ?Your doctor may prescribe medicine if lifestyle changes do not help enough and if: ?Your top number is above 130. ?Your bottom number is above 80. ?Your personal target blood pressure may vary. ?Follow these instructions at home: ?Eating and drinking ? ?If told, follow the DASH eating plan. To follow this plan: ?Fill one half of your plate at each meal with fruits and vegetables. ?Fill one fourth of your plate  at each meal with whole grains. Whole grains include whole-wheat pasta, brown rice, and whole-grain bread. ?Eat or drink low-fat dairy products, such as skim milk or low-fat yogurt. ?Fill one fourth of your plate at each meal with low-fat (lean) proteins. Low-fat proteins include fish, chicken without skin, eggs, beans, and tofu. ?Avoid fatty meat, cured and processed meat, or chicken with skin. ?Avoid pre-made or processed food. ?Limit the amount of salt in your diet to less than 1,500 mg each day. ?Do not drink alcohol if: ?Your doctor tells you not to drink. ?You are pregnant, may be pregnant, or are planning to become pregnant. ?If you drink alcohol: ?Limit how much you have to: ?0-1 drink a day for women. ?0-2 drinks a day for men. ?Know how much alcohol is in your drink. In the [place] one drink equals one 12 oz bottle of beer (355 mL), one 5 oz glass of wine (148 mL), or one 1? oz glass of hard liquor (44 mL). ?Lifestyle ? ?Work with your doctor to stay at a healthy weight or to lose weight. Ask your doctor what the best weight is for you. ?Get at least 30 minutes of exercise that causes your heart to beat faster (aerobic exercise) most days of the week. This may include walking, swimming, or biking. ?Get at least 30 minutes of exercise that strengthens your muscles (resistance exercise) at least 3 days a week. This may include lifting weights or doing Pilates. ?Do not smoke or use any products that contain nicotine or tobacco. If you need help quitting, ask your doctor. ?Check your blood pressure at home as told by your doctor. ?Keep all follow-up visits. ?Medicines ?Take over-the-counter and prescription medicines   only as told by your doctor. Follow directions carefully. ?Do not skip doses of blood pressure medicine. The medicine does not work as well if you skip doses. Skipping doses also puts you at risk for problems. ?Ask your doctor about side effects or reactions to medicines that you should watch  for. ?Contact a doctor if: ?You think you are having a reaction to the medicine you are taking. ?You have headaches that keep coming back. ?You feel dizzy. ?You have swelling in your ankles. ?You have trouble with your vision. ?Get help right away if: ?You get a very bad headache. ?You start to feel mixed up (confused). ?You feel weak or numb. ?You feel faint. ?You have very bad pain in your: ?Chest. ?Belly (abdomen). ?You vomit more than once. ?You have trouble breathing. ?These symptoms may be an emergency. Get help right away. Call 911. ?Do not wait to see if the symptoms will go away. ?Do not drive yourself to the hospital. ?Summary ?Hypertension is another name for high blood pressure. ?High blood pressure forces your heart to work harder to pump blood. ?For most people, a normal blood pressure is less than 120/80. ?Making healthy choices can help lower blood pressure. If your blood pressure does not get lower with healthy choices, you may need to take medicine. ?This information is not intended to replace advice given to you by your health care provider. Make sure you discuss any questions you have with your health care provider. ?Document Revised: 11/25/2020 Document Reviewed: 11/25/2020 ?[employer]vier Patient Education ? 2023 [employer] ? ?

## 2021-11-17 ENCOUNTER — Other Ambulatory Visit (HOSPITAL_COMMUNITY): Payer: Self-pay

## 2021-11-17 LAB — BMP8+EGFR
BUN/Creatinine Ratio: 13 (ref 9–23)
BUN: 18 mg/dL (ref 6–24)
CO2: 25 mmol/L (ref 20–29)
Calcium: 9.5 mg/dL (ref 8.7–10.2)
Chloride: 104 mmol/L (ref 96–106)
Creatinine, Ser: 1.42 mg/dL — ABNORMAL HIGH (ref 0.57–1.00)
Glucose: 95 mg/dL (ref 70–99)
Potassium: 4 mmol/L (ref 3.5–5.2)
Sodium: 142 mmol/L (ref 134–144)
eGFR: 44 mL/min/{1.73_m2} — ABNORMAL LOW (ref >59)

## 2021-11-25 ENCOUNTER — Other Ambulatory Visit (HOSPITAL_COMMUNITY): Payer: Self-pay

## 2021-11-28 ENCOUNTER — Other Ambulatory Visit: Payer: 59

## 2021-11-28 DIAGNOSIS — N289 Disorder of kidney and ureter, unspecified: Secondary | ICD-10-CM | POA: Diagnosis not present

## 2021-11-29 ENCOUNTER — Other Ambulatory Visit (HOSPITAL_COMMUNITY): Payer: Self-pay

## 2021-11-29 LAB — BMP8+EGFR
BUN/Creatinine Ratio: 16 (ref 9–23)
BUN: 16 mg/dL (ref 6–24)
CO2: 23 mmol/L (ref 20–29)
Calcium: 9.4 mg/dL (ref 8.7–10.2)
Chloride: 104 mmol/L (ref 96–106)
Creatinine, Ser: 1.03 mg/dL — ABNORMAL HIGH (ref 0.57–1.00)
Glucose: 93 mg/dL (ref 70–99)
Potassium: 4.3 mmol/L (ref 3.5–5.2)
Sodium: 143 mmol/L (ref 134–144)
eGFR: 64 mL/min/{1.73_m2} (ref >59)

## 2021-11-30 DIAGNOSIS — H524 Presbyopia: Secondary | ICD-10-CM | POA: Diagnosis not present

## 2021-12-07 ENCOUNTER — Other Ambulatory Visit (HOSPITAL_COMMUNITY): Payer: Self-pay

## 2021-12-07 MED ORDER — OMRON 3 SERIES BP MONITOR DEVI
0 refills | Status: DC
  Filled 2021-12-07: qty 1, 1d supply, fill #0

## 2021-12-08 ENCOUNTER — Other Ambulatory Visit (HOSPITAL_COMMUNITY): Payer: Self-pay

## 2021-12-30 ENCOUNTER — Other Ambulatory Visit (HOSPITAL_COMMUNITY): Payer: Self-pay

## 2022-01-02 ENCOUNTER — Ambulatory Visit: Payer: 59 | Admitting: Rheumatology

## 2022-01-03 NOTE — Progress Notes (Signed)
Office Visit Note  Patient: Rebecca Zamora             Date of Birth: Oct 22, 1965           MRN: [id number]             PCP: Minette Brine, FNP Referring: Minette Brine, FNP Visit Date: 01/16/2022 Occupation: _0 @  Subjective:  Pain in multiple joints  History of Present Illness: Rebecca Zamora is a 56 y.o. female seen in consultation per request of her PCP.  According to the patient her symptoms a started with joint pain when she was a teenager.  In her 32s her joint pain got worse and involves almost all of her joints in her lower back.  She states she was seen by several physicians but there was and not much help.  She was advised weight loss.  She started exercising on a regular basis.  She continues to have pain and discomfort in almost all of her joints.  She denies any history of joint swelling.  She states the joint pain has not been as severe since she has been exercising.  She is still has pain and discomfort in her shoulders, elbows, wrists, hands, hips, knees, ankles, feet and her lower back.  She states her PCP did labs in the past which came positive for ANA for that reason she was referred to me.  Has no history of oral ulcers, nasal ulcers, sicca symptoms, malar rash, Raynaud's phenomenon, photosensitivity or lymphadenopathy.  Experiences generalized achiness in her muscles.  She works out on a regular basis.  She goes for a spin class, body pump and also walks on a regular basis.  There is no family history of autoimmune disease.  Her mother had osteoarthritis.  There is history of multiple sclerosis in her mother and her sister.  Her sister and her father had a stroke.  She is gravida 2, para 2, miscarriages 0.  Activities of Daily Living:  Patient reports morning stiffness for a few minutes.   Patient Denies nocturnal pain.  Difficulty dressing/grooming: Denies Difficulty climbing stairs: Denies Difficulty getting out of chair: Denies Difficulty using hands for  taps, buttons, cutlery, and/or writing: Denies  Review of Systems  Constitutional:  Positive for fatigue.  HENT:  Negative for mouth sores and mouth dryness.   Eyes:  Negative for dryness.  Respiratory:  Negative for shortness of breath.   Cardiovascular:  Negative for chest pain and palpitations.  Gastrointestinal:  Positive for constipation. Negative for blood in stool and diarrhea.  Endocrine: Negative for increased urination.  Genitourinary:  Negative for involuntary urination.  Musculoskeletal:  Positive for joint pain, joint pain, myalgias, morning stiffness, muscle tenderness and myalgias. Negative for gait problem, joint swelling and muscle weakness.  Skin:  Negative for color change, rash, hair loss and sensitivity to sunlight.  Allergic/Immunologic: Negative for susceptible to infections.  Neurological:  Negative for dizziness and headaches.  Hematological:  Negative for swollen glands.  Psychiatric/Behavioral:  Negative for depressed mood and sleep disturbance. The patient is not nervous/anxious.     PMFS History:  Patient Active Problem List   Diagnosis Date Noted   Dyspnea on exertion 07/02/2018   Palpitations 07/02/2018   Mitral late systolic murmur 62/83/1517   Cardiomegaly    Atypical chest pain 06/24/2018   Essential hypertension 06/10/2018   Hypoglycemia 03/27/2018   Pelvic pain in female 01/23/2011   Fibroid uterus 01/23/2011   Menorrhagia 01/23/2011    Past Medical History:  Diagnosis Date   Cardiomegaly    HTN (hypertension)    No pertinent past medical history     Family History  Problem Relation Age of Onset   Multiple sclerosis Mother    Hypertension Father    Stroke Father    Gout Father    Multiple sclerosis Sister    Stroke Sister    Gout Brother    Hypertension Brother    Healthy Daughter    Healthy Son    Other Neg Hx    Diabetes Neg Hx    Past Surgical History:  Procedure Laterality Date   MYOMECTOMY  1999   Social History    Social History Narrative   Not on file   Immunization History  Administered Date(s) Administered   Influenza-Unspecified 12/05/2017, 11/21/2019   PFIZER(Purple Top)SARS-COV-2 Vaccination 08/01/2019, 08/22/2019   Tdap 04/29/2015   Zoster Recombinat (Shingrix) 06/16/2021, 08/18/2021     Objective: Vital Signs: BP (!) 133/93 (BP Location: Left Arm, Patient Position: Sitting, Cuff Size: Normal)   Pulse 66   Resp 15   Ht 5' 1.5" (1.562 m)   Wt 160 lb 9.6 oz (72.8 kg)   LMP 01/21/2011   BMI 29.85 kg/m    Physical Exam Vitals and nursing note reviewed.  Constitutional:      Appearance: She is well-developed.  HENT:     Head: Normocephalic and atraumatic.  Eyes:     Conjunctiva/sclera: Conjunctivae normal.  Cardiovascular:     Rate and Rhythm: Normal rate and regular rhythm.     Heart sounds: Murmur heard.  Pulmonary:     Effort: Pulmonary effort is normal.     Breath sounds: Normal breath sounds.  Abdominal:     General: Bowel sounds are normal.     Palpations: Abdomen is soft.  Musculoskeletal:     Cervical back: Normal range of motion.  Lymphadenopathy:     Cervical: No cervical adenopathy.  Skin:    General: Skin is warm and dry.     Capillary Refill: Capillary refill takes less than 2 seconds.  Neurological:     Mental Status: She is alert and oriented to person, place, and time.  Psychiatric:        Behavior: Behavior normal.      Musculoskeletal Exam: Cervical spine was in good range of motion with some discomfort.  Thoracic and lumbar spine were in good range of motion.  She had increased lumbar lordosis.  She had some discomfort in the lower lumbar region with mobility.  There was no point tenderness over thoracic or lumbar spine.  Shoulder joints, elbow joints, wrist joints, MCPs PIPs and DIPs with good range of motion.  She had bilateral PIP and DIP thickening consistent with osteoarthritis.  No synovitis was noted.  Hip joints and knee joints with good  range of motion without any warmth swelling or effusion.  There was no tenderness over ankles or MTPs.  She had hypermobility in multiple joints.  CDAI Exam: CDAI Score: -- Patient Global: --; Provider Global: -- Swollen: --; Tender: -- Joint Exam 01/16/2022   No joint exam has been documented for this visit   There is currently no information documented on the homunculus. Go to the Rheumatology activity and complete the homunculus joint exam.  Investigation: No additional findings.  Imaging: No results found.  Recent Labs: Lab Results  Component Value Date   WBC 4.3 06/16/2021   HGB 14.4 06/16/2021   PLT 269 06/16/2021   NA 143 11/28/2021  K 4.3 11/28/2021   CL 104 11/28/2021   CO2 23 11/28/2021   GLUCOSE 93 11/28/2021   BUN 16 11/28/2021   CREATININE 1.03 (H) 11/28/2021   BILITOT 0.6 06/16/2021   ALKPHOS 110 06/16/2021   AST 33 06/16/2021   ALT 19 06/16/2021   PROT 7.4 06/16/2021   ALBUMIN 4.7 06/16/2021   CALCIUM 9.4 11/28/2021   GFRAA 61 12/02/2019    Speciality Comments: No specialty comments available.  Procedures:  No procedures performed Allergies: Nifedipine and Hydrocodone   Assessment / Plan:     Visit Diagnoses: Positive ANA (antinuclear antibody) - 06/16/21: ANA positive, C3 WNL, dsDNA<1. 09/28/20: ANA negative, CRP WNL, ESR WNL. 06/15/21: ANA+, C3 WNL, dsDNA<1 -patient complains of pain and discomfort in multiple joints for many years.  No synovitis was noted on the examination.  She had positive ANA.  She is concerned about possible autoimmune disease due to ongoing joint pain and discomfort.  She notices intermittent swelling.  I will obtain labs today.  Plan: Protein / creatinine ratio, urine, ANA, RNP Antibody, Anti-Smith antibody, Sjogrens syndrome-A extractable nuclear antibody, Sjogrens syndrome-B extractable nuclear antibody, Anti-DNA antibody, double-stranded, C3 and C4, Beta-2 glycoprotein antibodies, Cardiolipin antibodies, IgG, IgM, IgA,  Anti-scleroderma antibody  Polyarthralgia -she complains of pain in almost all of her joints including shoulders, elbows, wrist, hands, hips, knees, ankles, feet, neck and lower back.  No synovitis was noted on the examination today.  I will obtain additional labs today.  Plan: Rheumatoid factor, Cyclic citrul peptide antibody, IgG  Pain in both hands -she complains of discomfort in her bilateral hands.  PIP and DIP thickening with no synovitis was noted.  Clinical findings are consistent with osteoarthritis.  Plan: XR Hand 2 View Right, XR Hand 2 View Left.  X-rays showed osteoarthritic changes in the PIPs and DIPs.  Chronic pain of both knees -she complains of discomfort in her bilateral knee joints.  She has hypermobility and hyperextension of her knee joints.  No warmth swelling or effusion was noted.  Plan: XR KNEE 3 VIEW RIGHT, XR KNEE 3 VIEW LEFT.  X-rays of bilateral knee joints were unremarkable.  Pain, neck -she had discomfort with range of motion of her cervical spine.  She had good range of motion.  She denies any history of radiculopathy.  Plan: XR Cervical Spine 2 or 3 views.  Multilevel spondylosis with large osteophytes at C5-C6 was noted.  Most significant narrowing was noted between C5-C6.  Facet joint arthropathy was noted.  Hypermobility of joint-increased joint pain with hypermobility was also discussed.  Isometric exercises were discussed.  She works out on a regular basis.  Other fatigue -she experiences increased fatigue.  Plan: CBC with Differential/Platelet, COMPLETE METABOLIC PANEL WITH GFR, CK  Myalgia-she consists of muscle pain intermittently.  She she had no tender points.  Essential hypertension - She is on amlodipine 5 mg p.o. daily.  Blood pressure was elevated at 133/93 today.  She was to be advised to monitor blood pressure closely and follow-up with her PCP.  Mitral late systolic murmur -I reviewed records from Dr. Irven Shelling visit in 2021.  She was evaluated by  Dr. Einar Gip in 2021.  Echocardiogram was done at the time.  Was initial concern about cardiomegaly which was not seen on the echocardiogram.  Vitamin D deficiency-patient states she takes vitamin D 2000 units daily for vitamin D deficiency.  History of multiple sclerosis (HCC)-mother, sister  Family history of stroke-Father, sister  Orders: Orders Placed This Encounter  Procedures   XR Cervical Spine 2 or 3 views   XR Hand 2 View Right   XR Hand 2 View Left   XR KNEE 3 VIEW RIGHT   XR KNEE 3 VIEW LEFT   CBC with Differential/Platelet   COMPLETE METABOLIC PANEL WITH GFR   Protein / creatinine ratio, urine   CK   Rheumatoid factor   Cyclic citrul peptide antibody, IgG   ANA   RNP Antibody   Anti-Smith antibody   Sjogrens syndrome-A extractable nuclear antibody   Sjogrens syndrome-B extractable nuclear antibody   Anti-DNA antibody, double-stranded   C3 and C4   Beta-2 glycoprotein antibodies   Cardiolipin antibodies, IgG, IgM, IgA   Anti-scleroderma antibody   No orders of the defined types were placed in this encounter.    Follow-Up Instructions: Return for +ANA.   Bo Merino, MD  Note - This record has been created using Editor, commissioning.  Chart creation errors have been sought, but may not always  have been located. Such creation errors do not reflect on  the standard of medical care.

## 2022-01-16 ENCOUNTER — Ambulatory Visit: Payer: 59

## 2022-01-16 ENCOUNTER — Encounter: Payer: Self-pay | Admitting: Rheumatology

## 2022-01-16 ENCOUNTER — Ambulatory Visit: Payer: 59 | Attending: Rheumatology | Admitting: Rheumatology

## 2022-01-16 ENCOUNTER — Ambulatory Visit (INDEPENDENT_AMBULATORY_CARE_PROVIDER_SITE_OTHER): Payer: 59

## 2022-01-16 VITALS — BP 133/93 | HR 66 | Resp 15 | Ht 61.5 in | Wt 160.6 lb

## 2022-01-16 DIAGNOSIS — M791 Myalgia, unspecified site: Secondary | ICD-10-CM

## 2022-01-16 DIAGNOSIS — I10 Essential (primary) hypertension: Secondary | ICD-10-CM | POA: Diagnosis not present

## 2022-01-16 DIAGNOSIS — M2550 Pain in unspecified joint: Secondary | ICD-10-CM | POA: Diagnosis not present

## 2022-01-16 DIAGNOSIS — I340 Nonrheumatic mitral (valve) insufficiency: Secondary | ICD-10-CM

## 2022-01-16 DIAGNOSIS — M79642 Pain in left hand: Secondary | ICD-10-CM

## 2022-01-16 DIAGNOSIS — M542 Cervicalgia: Secondary | ICD-10-CM

## 2022-01-16 DIAGNOSIS — M25562 Pain in left knee: Secondary | ICD-10-CM

## 2022-01-16 DIAGNOSIS — M249 Joint derangement, unspecified: Secondary | ICD-10-CM | POA: Diagnosis not present

## 2022-01-16 DIAGNOSIS — M25561 Pain in right knee: Secondary | ICD-10-CM | POA: Diagnosis not present

## 2022-01-16 DIAGNOSIS — I517 Cardiomegaly: Secondary | ICD-10-CM

## 2022-01-16 DIAGNOSIS — R5383 Other fatigue: Secondary | ICD-10-CM

## 2022-01-16 DIAGNOSIS — M79641 Pain in right hand: Secondary | ICD-10-CM | POA: Diagnosis not present

## 2022-01-16 DIAGNOSIS — G8929 Other chronic pain: Secondary | ICD-10-CM

## 2022-01-16 DIAGNOSIS — Z823 Family history of stroke: Secondary | ICD-10-CM

## 2022-01-16 DIAGNOSIS — R768 Other specified abnormal immunological findings in serum: Secondary | ICD-10-CM

## 2022-01-16 DIAGNOSIS — G35 Multiple sclerosis: Secondary | ICD-10-CM

## 2022-01-16 DIAGNOSIS — E559 Vitamin D deficiency, unspecified: Secondary | ICD-10-CM

## 2022-01-16 DIAGNOSIS — R002 Palpitations: Secondary | ICD-10-CM

## 2022-01-19 LAB — CBC WITH DIFFERENTIAL/PLATELET
Absolute Monocytes: 360 cells/uL (ref 200–950)
Basophils Absolute: 40 cells/uL (ref 0–200)
Basophils Relative: 0.8 %
Eosinophils Absolute: 150 cells/uL (ref 15–500)
Eosinophils Relative: 3 %
HCT: 41.3 % (ref 35.0–45.0)
Hemoglobin: 13.8 g/dL (ref 11.7–15.5)
Lymphs Abs: 2165 cells/uL (ref 850–3900)
MCH: 29.7 pg (ref 27.0–33.0)
MCHC: 33.4 g/dL (ref 32.0–36.0)
MCV: 89 fL (ref 80.0–100.0)
MPV: 10.7 fL (ref 7.5–12.5)
Monocytes Relative: 7.2 %
Neutro Abs: 2285 cells/uL (ref 1500–7800)
Neutrophils Relative %: 45.7 %
Platelets: 306 10*3/uL (ref 140–400)
RBC: 4.64 10*6/uL (ref 3.80–5.10)
RDW: 13.3 % (ref 11.0–15.0)
Total Lymphocyte: 43.3 %
WBC: 5 10*3/uL (ref 3.8–10.8)

## 2022-01-19 LAB — BETA-2 GLYCOPROTEIN ANTIBODIES
Beta-2 Glyco 1 IgA: <2 U/mL (ref <20.0)
Beta-2 Glyco 1 IgM: <2 U/mL (ref <20.0)
Beta-2 Glyco I IgG: <2 U/mL (ref <20.0)

## 2022-01-19 LAB — COMPLETE METABOLIC PANEL WITH GFR
AG Ratio: 1.4 (calc) (ref 1.0–2.5)
ALT: 24 U/L (ref 6–29)
AST: 31 U/L (ref 10–35)
Albumin: 4.2 g/dL (ref 3.6–5.1)
Alkaline phosphatase (APISO): 93 U/L (ref 37–153)
BUN: 24 mg/dL (ref 7–25)
CO2: 33 mmol/L — ABNORMAL HIGH (ref 20–32)
Calcium: 9.2 mg/dL (ref 8.6–10.4)
Chloride: 105 mmol/L (ref 98–110)
Creat: 0.98 mg/dL (ref 0.50–1.03)
Globulin: 2.9 g/dL (calc) (ref 1.9–3.7)
Glucose, Bld: 86 mg/dL (ref 65–99)
Potassium: 4 mmol/L (ref 3.5–5.3)
Sodium: 143 mmol/L (ref 135–146)
Total Bilirubin: 0.4 mg/dL (ref 0.2–1.2)
Total Protein: 7.1 g/dL (ref 6.1–8.1)
eGFR: 68 mL/min/{1.73_m2} (ref >=60)

## 2022-01-19 LAB — ANTI-SCLERODERMA ANTIBODY: Scleroderma (Scl-70) (ENA) Antibody, IgG: <1 AI

## 2022-01-19 LAB — PROTEIN / CREATININE RATIO, URINE
Creatinine, Urine: 106 mg/dL (ref 20–275)
Protein/Creat Ratio: 75 mg/g creat (ref 24–184)
Protein/Creatinine Ratio: 0.075 mg/mg creat (ref 0.024–0.184)
Total Protein, Urine: 8 mg/dL (ref 5–24)

## 2022-01-19 LAB — CARDIOLIPIN ANTIBODIES, IGG, IGM, IGA
Anticardiolipin IgA: <2 APL-U/mL (ref <20.0)
Anticardiolipin IgG: <2 GPL-U/mL (ref <20.0)
Anticardiolipin IgM: <2 MPL-U/mL (ref <20.0)

## 2022-01-19 LAB — C3 AND C4
C3 Complement: 150 mg/dL (ref 83–193)
C4 Complement: 36 mg/dL (ref 15–57)

## 2022-01-19 LAB — SJOGRENS SYNDROME-B EXTRACTABLE NUCLEAR ANTIBODY: SSB (La) (ENA) Antibody, IgG: <1 AI

## 2022-01-19 LAB — ANA: Anti Nuclear Antibody (ANA): NEGATIVE

## 2022-01-19 LAB — CYCLIC CITRUL PEPTIDE ANTIBODY, IGG: Cyclic Citrullin Peptide Ab: <16 UNITS

## 2022-01-19 LAB — ANTI-SMITH ANTIBODY: ENA SM Ab Ser-aCnc: <1 AI

## 2022-01-19 LAB — CK: Total CK: 235 U/L — ABNORMAL HIGH (ref 29–143)

## 2022-01-19 LAB — SJOGRENS SYNDROME-A EXTRACTABLE NUCLEAR ANTIBODY: SSA (Ro) (ENA) Antibody, IgG: <1 AI

## 2022-01-19 LAB — RNP ANTIBODY: Ribonucleic Protein(ENA) Antibody, IgG: <1 AI

## 2022-01-19 LAB — ANTI-DNA ANTIBODY, DOUBLE-STRANDED: ds DNA Ab: <1 IU/mL

## 2022-01-19 LAB — RHEUMATOID FACTOR: Rheumatoid fact SerPl-aCnc: <14 IU/mL (ref <14)

## 2022-01-23 NOTE — Progress Notes (Signed)
All the labs are within normal limits except for mildly elevated muscle enzymes.  I would repeat muscle enzymes at the follow-up visit.  We will discuss results at the follow-up visit.

## 2022-02-20 NOTE — Progress Notes (Signed)
Office Visit Note  Patient: Rebecca Zamora             Date of Birth: 07/24/1965           MRN: [id number]             PCP: Arnette Felts, FNP Referring: Arnette Felts, FNP Visit Date: 02/22/2022 Occupation: @GUAROCC @  Subjective:  Joint stiffness  History of Present Illness: Rebecca Zamora is a 57 y.o. female with history of osteoarthritis.  She continues to have some pain and stiffness in her bilateral hands, bilateral knee joints and her neck.  She also has discomfort in her joints after exercise.  She states her muscles ache after exercise as well.  She has nocturnal pain at times.  She denies any history of oral ulcers, nasal ulcers, malar rash, photosensitivity, Raynaud's phenomenon or inflammatory arthritis.  There is no history of lymphadenopathy.  She has been exercising on a regular basis.  She is very active at the gym and participates in the spin class, body bump last and walks on a regular basis.    Activities of Daily Living:  Patient reports morning stiffness for all day. Patient Reports nocturnal pain.  Difficulty dressing/grooming: Denies Difficulty climbing stairs: Denies Difficulty getting out of chair: Denies Difficulty using hands for taps, buttons, cutlery, and/or writing: Reports  Review of Systems  Constitutional:  Positive for fatigue.  HENT:  Negative for mouth sores and mouth dryness.   Eyes:  Negative for dryness.  Respiratory:  Negative for shortness of breath.   Cardiovascular:  Negative for chest pain and palpitations.  Gastrointestinal:  Positive for constipation. Negative for blood in stool and diarrhea.  Endocrine: Negative for increased urination.  Genitourinary:  Negative for involuntary urination.  Musculoskeletal:  Positive for joint pain, joint pain, myalgias, morning stiffness, muscle tenderness and myalgias. Negative for gait problem, joint swelling and muscle weakness.  Skin:  Negative for color change, rash, hair loss and  sensitivity to sunlight.  Allergic/Immunologic: Negative for susceptible to infections.  Neurological:  Negative for dizziness and headaches.  Hematological:  Negative for swollen glands.  Psychiatric/Behavioral:  Negative for depressed mood and sleep disturbance. The patient is not nervous/anxious.     PMFS History:  Patient Active Problem List   Diagnosis Date Noted   Primary osteoarthritis of both hands 02/22/2022   DDD (degenerative disc disease), cervical 02/22/2022   Dyspnea on exertion 07/02/2018   Palpitations 07/02/2018   Mitral late systolic murmur 07/02/2018   Cardiomegaly    Atypical chest pain 06/24/2018   Essential hypertension 06/10/2018   Hypoglycemia 03/27/2018   Pelvic pain in female 01/23/2011   Fibroid uterus 01/23/2011   Menorrhagia 01/23/2011    Past Medical History:  Diagnosis Date   Cardiomegaly    HTN (hypertension)    No pertinent past medical history     Family History  Problem Relation Age of Onset   Multiple sclerosis Mother    Hypertension Father    Stroke Father    Gout Father    Multiple sclerosis Sister    Stroke Sister    Gout Brother    Hypertension Brother    Healthy Daughter    Healthy Son    Other Neg Hx    Diabetes Neg Hx    Past Surgical History:  Procedure Laterality Date   MYOMECTOMY  1999   Social History   Social History Narrative   Not on file   Immunization History  Administered Date(s) Administered  Influenza-Unspecified 12/05/2017, 11/21/2019   PFIZER(Purple Top)SARS-COV-2 Vaccination 08/01/2019, 08/22/2019   Tdap 04/29/2015   Zoster Recombinat (Shingrix) 06/16/2021, 08/18/2021     Objective: Vital Signs: BP 121/81 (BP Location: Left Arm, Patient Position: Sitting, Cuff Size: Normal)   Pulse 62   Resp 14   Ht 5' 1.5" (1.562 m)   Wt 156 lb (70.8 kg)   LMP 01/21/2011   BMI 29.00 kg/m    Physical Exam Vitals and nursing note reviewed.  Constitutional:      Appearance: She is well-developed.  HENT:      Head: Normocephalic and atraumatic.  Eyes:     Conjunctiva/sclera: Conjunctivae normal.  Cardiovascular:     Rate and Rhythm: Normal rate and regular rhythm.     Heart sounds: Normal heart sounds.  Pulmonary:     Effort: Pulmonary effort is normal.     Breath sounds: Normal breath sounds.  Abdominal:     General: Bowel sounds are normal.     Palpations: Abdomen is soft.  Musculoskeletal:     Cervical back: Normal range of motion.  Lymphadenopathy:     Cervical: No cervical adenopathy.  Skin:    General: Skin is warm and dry.     Capillary Refill: Capillary refill takes less than 2 seconds.  Neurological:     Mental Status: She is alert and oriented to person, place, and time.  Psychiatric:        Behavior: Behavior normal.      Musculoskeletal Exam: Cervical spine was in good range of motion without discomfort.  She feels a stiffness in her cervical spine with range of motion.  Thoracic and lumbar spine were in good range of motion.  She had good range of motion of her shoulders, elbows, wrist, MCPs PIPs and DIPs.  She had bilateral PIP and DIP thickening without synovitis.  Hip joints and knee joints with good range of motion.  No warmth swelling or effusion was noted.  There was no tenderness over ankles or MTPs.  Hypermobility was noted in her joints.  CDAI Exam: CDAI Score: -- Patient Global: --; Provider Global: -- Swollen: --; Tender: -- Joint Exam 02/22/2022   No joint exam has been documented for this visit   There is currently no information documented on the homunculus. Go to the Rheumatology activity and complete the homunculus joint exam.  Investigation: No additional findings.  Imaging: No results found.  Recent Labs: Lab Results  Component Value Date   WBC 5.0 01/16/2022   HGB 13.8 01/16/2022   PLT 306 01/16/2022   NA 143 01/16/2022   K 4.0 01/16/2022   CL 105 01/16/2022   CO2 33 (H) 01/16/2022   GLUCOSE 86 01/16/2022   BUN 24 01/16/2022    CREATININE 0.98 01/16/2022   BILITOT 0.4 01/16/2022   ALKPHOS 110 06/16/2021   AST 31 01/16/2022   ALT 24 01/16/2022   PROT 7.1 01/16/2022   ALBUMIN 4.7 06/16/2021   CALCIUM 9.2 01/16/2022   GFRAA 61 12/02/2019   January 16, 2022 ANA negative, ENA negative, beta-2 GP 1 negative, anticardiolipin negative, complements normal, RF negative, anti-CCP negative, urine protein creatinine ratio normal, CK235  Speciality Comments: No specialty comments available.  Procedures:  No procedures performed Allergies: Nifedipine and Hydrocodone   Assessment / Plan:     Visit Diagnoses: Polyarthralgia - History of pain in multiple joints and muscles.  No synovitis was noted.  All autoimmune workup negative.  ANA was low titer positive which is negative now.  Complete ENA panel, beta-2 GP 1, anticardiolipin and complements were normal.  Lab results were discussed with the patient at length.  Primary osteoarthritis of both hands -she complains of pain and stiffness in her bilateral hands.  No synovitis was noted.  Clinical and radiographic findings are consistent with osteoarthritis.  X-ray findings were reviewed with the patient.  Joint protection muscle strengthening was discussed.  Chronic pain of both knees - Hypermobility and hyperextension of both knee joints was noted.  X-rays were unremarkable.  X-rays were reviewed with the patient.  Lower extremity muscle strengthening exercises were advised.  DDD (degenerative disc disease), cervical - She of chronic neck pain.  X-rays showed multilevel spondylosis with large osteophytes at C5-C6.  Facet joint arthropathy was noted.  I reviewed x-rays with the patient.  I offered physical therapy which she declined.  I gave her a handout on neck exercises.  I advised her to contact us if she develops increased pain or radiculopathy.  Hypermobility of joint - Isometric exercises were advised.  Discussed physical therapy but patient declined.  Myalgia - Chronic  muscle pain most likely myofascial pain.  Her CK is mildly elevated at 235.  She works out on a regular basis.  At this point I do not think that she needs further workup.  Vitamin D deficiency - She takes vitamin D 2000 units daily.  Essential hypertension-blood pressure was normal at 121/81.  History of multiple sclerosis (HCC)-mother, sister  Family history of stroke-Father, sister  Orders: No orders of the defined types were placed in this encounter.  No orders of the defined types were placed in this encounter.    Follow-Up Instructions: Return in about 1 year (around 02/23/2023) for Osteoarthritis.   Pollyann Savoy, MD  Note - This record has been created using Animal nutritionist.  Chart creation errors have been sought, but may not always  have been located. Such creation errors do not reflect on  the standard of medical care.

## 2022-02-22 ENCOUNTER — Ambulatory Visit: Payer: Commercial Managed Care - PPO | Attending: Rheumatology | Admitting: Rheumatology

## 2022-02-22 ENCOUNTER — Encounter: Payer: Self-pay | Admitting: Rheumatology

## 2022-02-22 VITALS — BP 121/81 | HR 62 | Resp 14 | Ht 61.5 in | Wt 156.0 lb

## 2022-02-22 DIAGNOSIS — G35 Multiple sclerosis: Secondary | ICD-10-CM | POA: Diagnosis not present

## 2022-02-22 DIAGNOSIS — M791 Myalgia, unspecified site: Secondary | ICD-10-CM | POA: Diagnosis not present

## 2022-02-22 DIAGNOSIS — M25561 Pain in right knee: Secondary | ICD-10-CM | POA: Diagnosis not present

## 2022-02-22 DIAGNOSIS — I10 Essential (primary) hypertension: Secondary | ICD-10-CM | POA: Diagnosis not present

## 2022-02-22 DIAGNOSIS — M249 Joint derangement, unspecified: Secondary | ICD-10-CM

## 2022-02-22 DIAGNOSIS — M5030 Other cervical disc degeneration, unspecified cervical region: Secondary | ICD-10-CM

## 2022-02-22 DIAGNOSIS — G8929 Other chronic pain: Secondary | ICD-10-CM

## 2022-02-22 DIAGNOSIS — M2550 Pain in unspecified joint: Secondary | ICD-10-CM

## 2022-02-22 DIAGNOSIS — E559 Vitamin D deficiency, unspecified: Secondary | ICD-10-CM

## 2022-02-22 DIAGNOSIS — M19041 Primary osteoarthritis, right hand: Secondary | ICD-10-CM | POA: Insufficient documentation

## 2022-02-22 DIAGNOSIS — Z823 Family history of stroke: Secondary | ICD-10-CM | POA: Diagnosis not present

## 2022-02-22 DIAGNOSIS — M19042 Primary osteoarthritis, left hand: Secondary | ICD-10-CM

## 2022-02-22 DIAGNOSIS — M25562 Pain in left knee: Secondary | ICD-10-CM

## 2022-02-22 NOTE — Patient Instructions (Signed)
Cervical Strain and Sprain Rehab Ask your health care provider which exercises are safe for you. Do exercises exactly as told by your health care provider and adjust them as directed. It is normal to feel mild stretching, pulling, tightness, or discomfort as you do these exercises. Stop right away if you feel sudden pain or your pain gets worse. Do not begin these exercises until told by your health care provider. Stretching and range-of-motion exercises Cervical side bending  Using good posture, sit on a stable chair or stand up. Without moving your shoulders, slowly tilt your left / right ear to your shoulder until you feel a stretch in the neck muscles on the opposite side. You should be looking straight ahead. Hold for __________ seconds. Repeat with the other side of your neck. Repeat __________ times. Complete this exercise __________ times a day. Cervical rotation  Using good posture, sit on a stable chair or stand up. Slowly turn your head to the side as if you are looking over your left / right shoulder. Keep your eyes level with the ground. Stop when you feel a stretch along the side and the back of your neck. Hold for __________ seconds. Repeat this by turning to your other side. Repeat __________ times. Complete this exercise __________ times a day. Thoracic extension and pectoral stretch  Roll a towel or a small blanket so it is about 4 inches (10 cm) in diameter. Lie down on your back on a firm surface. Put the towel in the middle of your back across your spine. It should not be under your shoulder blades. Put your hands behind your head and let your elbows fall out to your sides. Hold for __________ seconds. Repeat __________ times. Complete this exercise __________ times a day. Strengthening exercises Upper cervical flexion  Lie on your back with a thin pillow behind your head or a small, rolled-up towel under your neck. Gently tuck your chin toward your chest and nod  your head down to look toward your feet. Do not lift your head off the pillow. Hold for __________ seconds. Release the tension slowly. Relax your neck muscles completely before you repeat this exercise. Repeat __________ times. Complete this exercise __________ times a day. Cervical extension  Stand about 6 inches (15 cm) away from a wall, with your back facing the wall. Place a soft object, about 6-8 inches (15-20 cm) in diameter, between the back of your head and the wall. A soft object could be a small pillow, a ball, or a folded towel. Gently tilt your head back and press into the soft object. Keep your jaw and forehead relaxed. Hold for __________ seconds. Release the tension slowly. Relax your neck muscles completely before you repeat this exercise. Repeat __________ times. Complete this exercise __________ times a day. Posture and body mechanics Body mechanics refer to the movements and positions of your body while you do your daily activities. Posture is part of body mechanics. Good posture and healthy body mechanics can help to relieve stress in your body's tissues and joints. Good posture means that your spine is in its natural S-curve position (your spine is neutral), your shoulders are pulled back slightly, and your head is not tipped forward. The following are general guidelines for using improved posture and body mechanics in your everyday activities. Sitting  When sitting, keep your spine neutral and keep your feet flat on the floor. Use a footrest, if needed, and keep your thighs parallel to the floor. Avoid rounding   your shoulders. Avoid tilting your head forward. When working at a desk or a computer, keep your desk at a height where your hands are slightly lower than your elbows. Slide your chair under your desk so you are close enough to maintain good posture. When working at a computer, place your monitor at a height where you are looking straight ahead and you do not have to  tilt your head forward or downward to look at the screen. Standing  When standing, keep your spine neutral and keep your feet about hip-width apart. Keep a slight bend in your knees. Your ears, shoulders, and hips should line up. When you do a task in which you stand in one place for a long time, place one foot up on a stable object that is 2-4 inches (5-10 cm) high, such as a footstool. This helps keep your spine neutral. Resting When lying down and resting, avoid positions that are most painful for you. Try to support your neck in a neutral position. You can use a contour pillow or a small rolled-up towel. Your pillow should support your neck but not push on it. This information is not intended to replace advice given to you by your health care provider. Make sure you discuss any questions you have with your health care provider. Document Revised: 08/29/2021 Document Reviewed: 08/29/2021 Elsevier Patient Education  2023 Elsevier Inc.  

## 2022-02-24 ENCOUNTER — Other Ambulatory Visit (HOSPITAL_COMMUNITY): Payer: Self-pay

## 2022-02-24 ENCOUNTER — Ambulatory Visit: Payer: Self-pay | Admitting: Rheumatology

## 2022-03-20 ENCOUNTER — Ambulatory Visit: Payer: Commercial Managed Care - PPO | Admitting: Nurse Practitioner

## 2022-03-20 ENCOUNTER — Encounter: Payer: Self-pay | Admitting: Nurse Practitioner

## 2022-03-20 ENCOUNTER — Other Ambulatory Visit (HOSPITAL_COMMUNITY): Payer: Self-pay

## 2022-03-20 VITALS — BP 120/68 | HR 60 | Temp 98.1°F | Ht 61.5 in | Wt 155.0 lb

## 2022-03-20 DIAGNOSIS — N183 Chronic kidney disease, stage 3 unspecified: Secondary | ICD-10-CM | POA: Diagnosis not present

## 2022-03-20 DIAGNOSIS — M791 Myalgia, unspecified site: Secondary | ICD-10-CM | POA: Diagnosis not present

## 2022-03-20 DIAGNOSIS — N1831 Chronic kidney disease, stage 3a: Secondary | ICD-10-CM | POA: Insufficient documentation

## 2022-03-20 DIAGNOSIS — I129 Hypertensive chronic kidney disease with stage 1 through stage 4 chronic kidney disease, or unspecified chronic kidney disease: Secondary | ICD-10-CM | POA: Diagnosis not present

## 2022-03-20 DIAGNOSIS — D689 Coagulation defect, unspecified: Secondary | ICD-10-CM | POA: Insufficient documentation

## 2022-03-20 MED ORDER — CYCLOBENZAPRINE HCL 10 MG PO TABS
10.0000 mg | ORAL_TABLET | Freq: Three times a day (TID) | ORAL | 2 refills | Status: DC | PRN
  Filled 2022-03-20: qty 30, 10d supply, fill #0
  Filled 2022-05-11: qty 30, 10d supply, fill #1
  Filled 2022-10-31: qty 30, 10d supply, fill #2

## 2022-03-20 NOTE — Progress Notes (Signed)
I,Tianna Badgett,acting as a Education administrator for [employer], FNP.,have documented all relevant documentation on the behalf of Minette Brine, FNP,as directed by  Minette Brine, FNP while in the presence of Minette Brine, [place].  Subjective:     Patient ID: Rebecca Zamora , female    DOB: 08-Jan-1966 , 57 y.o.   MRN: [id number]   Chief Complaint  Patient presents with   Hypertension    HPI  Patient presents today for HTN follow up. Reports having "chills" last week, today is feeling better. She is due to f/u with Dr. Estanislado Pandy.   Wt Readings from Last 3 Encounters: 03/20/22 : 155 lb (70.3 kg) 02/22/22 : 156 lb (70.8 kg) 01/16/22 : 160 lb 9.6 oz (72.8 kg)    Hypertension This is a chronic problem. The current episode started more than 1 year ago. The problem is controlled. Pertinent negatives include no anxiety. Past treatments include calcium channel blockers. There are no compliance problems.  There is no history of angina or kidney disease. There is no history of chronic renal disease.     Past Medical History:  Diagnosis Date   Cardiomegaly    HTN (hypertension)    No pertinent past medical history      Family History  Problem Relation Age of Onset   Multiple sclerosis Mother    Hypertension Father    Stroke Father    Gout Father    Multiple sclerosis Sister    Stroke Sister    Gout Brother    Hypertension Brother    Healthy Daughter    Healthy Son    Other Neg Hx    Diabetes Neg Hx      Current Outpatient Medications:    cyclobenzaprine (FLEXERIL) 10 MG tablet, Take 1 tablet (10 mg total) by mouth 3 (three) times daily as needed for muscle spasms., Disp: 30 tablet, Rfl: 2   EPINEPHrine 0.3 mg/0.3 mL IJ SOAJ injection, Inject 0.3 mg into the muscle as needed for anaphylaxis., Disp: 2 each, Rfl: 1   triamcinolone ointment (KENALOG) 0.1 %, Apply 1 application topically 2 (two) times daily as needed. Avoid the face., Disp: 454 g, Rfl: 1   VITAMIN D PO, Take 2,000 Units by  mouth daily., Disp: , Rfl:    amLODipine (NORVASC) 5 MG tablet, Take 1 tablet (5 mg total) by mouth daily., Disp: 90 tablet, Rfl: 1   Blood Pressure Monitoring (OMRON 3 SERIES BP MONITOR) DEVI, Use as directed, Disp: 1 each, Rfl: 0   Allergies  Allergen Reactions   Nifedipine Other (See Comments)    Depression, nausea, fatigue   Hydrocodone Nausea Only     Review of Systems  Constitutional: Negative.   Respiratory: Negative.    Cardiovascular: Negative.   Gastrointestinal: Negative.   Neurological: Negative.   Psychiatric/Behavioral: Negative.       Today's Vitals   03/20/22 1555  BP: 120/68  Pulse: 60  Temp: 98.1 F (36.7 C)  TempSrc: Oral  Weight: 155 lb (70.3 kg)  Height: 5' 1.5" (1.562 m)   Body mass index is 28.81 kg/m.   Objective:  Physical Exam Vitals reviewed.  Constitutional:      General: She is not in acute distress.    Appearance: Normal appearance. She is obese.  Cardiovascular:     Rate and Rhythm: Normal rate and regular rhythm.     Pulses: Normal pulses.     Heart sounds: Normal heart sounds. No murmur heard. Pulmonary:     Effort: Pulmonary  effort is normal. No respiratory distress.     Breath sounds: Normal breath sounds. No wheezing.  Neurological:     Mental Status: She is alert.         Assessment And Plan:     1. Benign hypertension with CKD (chronic kidney disease) stage III (HCC) Comments: Blood pressure is well-controlled.  Continue current medications.  Continue avoiding NSAIDs  2. Muscle ache Comments: Continues to use muscle relaxer as needed for muscle aches - cyclobenzaprine (FLEXERIL) 10 MG tablet; Take 1 tablet (10 mg total) by mouth 3 (three) times daily as needed for muscle spasms.  Dispense: 30 tablet; Refill: 2     Patient was given opportunity to ask questions. Patient verbalized understanding of the plan and was able to repeat key elements of the plan. All questions were answered to their satisfaction.  Minette Brine, FNP   I, Minette Brine, FNP, have reviewed all documentation for this visit. The documentation on 03/20/22 for the exam, diagnosis, procedures, and orders are all accurate and complete.   IF YOU HAVE BEEN REFERRED TO A SPECIALIST, IT MAY TAKE 1-2 WEEKS TO SCHEDULE/PROCESS THE REFERRAL. IF YOU HAVE NOT HEARD FROM US/SPECIALIST IN TWO WEEKS, PLEASE GIVE [place] A CALL AT [phone].   THE PATIENT IS ENCOURAGED TO PRACTICE SOCIAL DISTANCING DUE TO THE COVID-19 PANDEMIC.

## 2022-03-20 NOTE — Patient Instructions (Signed)
Hypertension, Adult High blood pressure (hypertension) is when the force of blood pumping through the arteries is too strong. The arteries are the blood vessels that carry blood from the heart throughout the body. Hypertension forces the heart to work harder to pump blood and may cause arteries to become narrow or stiff. Untreated or uncontrolled hypertension can lead to a heart attack, heart failure, a stroke, kidney disease, and other problems. A blood pressure reading consists of a higher number over a lower number. Ideally, your blood pressure should be below 120/80. The first ("top") number is called the systolic pressure. It is a measure of the pressure in your arteries as your heart beats. The second ("bottom") number is called the diastolic pressure. It is a measure of the pressure in your arteries as the heart relaxes. What are the causes? The exact cause of this condition is not known. There are some conditions that result in high blood pressure. What increases the risk? Certain factors may make you more likely to develop high blood pressure. Some of these risk factors are under your control, including: Smoking. Not getting enough exercise or physical activity. Being overweight. Having too much fat, sugar, calories, or salt (sodium) in your diet. Drinking too much alcohol. Other risk factors include: Having a personal history of heart disease, diabetes, high cholesterol, or kidney disease. Stress. Having a family history of high blood pressure and high cholesterol. Having obstructive sleep apnea. Age. The risk increases with age. What are the signs or symptoms? High blood pressure may not cause symptoms. Very high blood pressure (hypertensive crisis) may cause: Headache. Fast or irregular heartbeats (palpitations). Shortness of breath. Nosebleed. Nausea and vomiting. Vision changes. Severe chest pain, dizziness, and seizures. How is this diagnosed? This condition is diagnosed by  measuring your blood pressure while you are seated, with your arm resting on a flat surface, your legs uncrossed, and your feet flat on the floor. The cuff of the blood pressure monitor will be placed directly against the skin of your upper arm at the level of your heart. Blood pressure should be measured at least twice using the same arm. Certain conditions can cause a difference in blood pressure between your right and left arms. If you have a high blood pressure reading during one visit or you have normal blood pressure with other risk factors, you may be asked to: Return on a different day to have your blood pressure checked again. Monitor your blood pressure at home for 1 week or longer. If you are diagnosed with hypertension, you may have other blood or imaging tests to help your health care provider understand your overall risk for other conditions. How is this treated? This condition is treated by making healthy lifestyle changes, such as eating healthy foods, exercising more, and reducing your alcohol intake. You may be referred for counseling on a healthy diet and physical activity. Your health care provider may prescribe medicine if lifestyle changes are not enough to get your blood pressure under control and if: Your systolic blood pressure is above 130. Your diastolic blood pressure is above 80. Your personal target blood pressure may vary depending on your medical conditions, your age, and other factors. Follow these instructions at home: Eating and drinking  Eat a diet that is high in fiber and potassium, and low in sodium, added sugar, and fat. An example of this eating plan is called the DASH diet. DASH stands for Dietary Approaches to Stop Hypertension. To eat this way: Eat   plenty of fresh fruits and vegetables. Try to fill one half of your plate at each meal with fruits and vegetables. Eat whole grains, such as whole-wheat pasta, brown rice, or whole-grain bread. Fill about one  fourth of your plate with whole grains. Eat or drink low-fat dairy products, such as skim milk or low-fat yogurt. Avoid fatty cuts of meat, processed or cured meats, and poultry with skin. Fill about one fourth of your plate with lean proteins, such as fish, chicken without skin, beans, eggs, or tofu. Avoid pre-made and processed foods. These tend to be higher in sodium, added sugar, and fat. Reduce your daily sodium intake. Many people with hypertension should eat less than 1,500 mg of sodium a day. Do not drink alcohol if: Your health care provider tells you not to drink. You are pregnant, may be pregnant, or are planning to become pregnant. If you drink alcohol: Limit how much you have to: 0-1 drink a day for women. 0-2 drinks a day for men. Know how much alcohol is in your drink. In the [place] one drink equals one 12 oz bottle of beer (355 mL), one 5 oz glass of wine (148 mL), or one 1 oz glass of hard liquor (44 mL). Lifestyle  Work with your health care provider to maintain a healthy body weight or to lose weight. Ask what an ideal weight is for you. Get at least 30 minutes of exercise that causes your heart to beat faster (aerobic exercise) most days of the week. Activities may include walking, swimming, or biking. Include exercise to strengthen your muscles (resistance exercise), such as Pilates or lifting weights, as part of your weekly exercise routine. Try to do these types of exercises for 30 minutes at least 3 days a week. Do not use any products that contain nicotine or tobacco. These products include cigarettes, chewing tobacco, and vaping devices, such as e-cigarettes. If you need help quitting, ask your health care provider. Monitor your blood pressure at home as told by your health care provider. Keep all follow-up visits. This is important. Medicines Take over-the-counter and prescription medicines only as told by your health care provider. Follow directions carefully. Blood  pressure medicines must be taken as prescribed. Do not skip doses of blood pressure medicine. Doing this puts you at risk for problems and can make the medicine less effective. Ask your health care provider about side effects or reactions to medicines that you should watch for. Contact a health care provider if you: Think you are having a reaction to a medicine you are taking. Have headaches that keep coming back (recurring). Feel dizzy. Have swelling in your ankles. Have trouble with your vision. Get help right away if you: Develop a severe headache or confusion. Have unusual weakness or numbness. Feel faint. Have severe pain in your chest or abdomen. Vomit repeatedly. Have trouble breathing. These symptoms may be an emergency. Get help right away. Call 911. Do not wait to see if the symptoms will go away. Do not drive yourself to the hospital. Summary Hypertension is when the force of blood pumping through your arteries is too strong. If this condition is not controlled, it may put you at risk for serious complications. Your personal target blood pressure may vary depending on your medical conditions, your age, and other factors. For most people, a normal blood pressure is less than 120/80. Hypertension is treated with lifestyle changes, medicines, or a combination of both. Lifestyle changes include losing weight, eating a healthy,   low-sodium diet, exercising more, and limiting alcohol. This information is not intended to replace advice given to you by your health care provider. Make sure you discuss any questions you have with your health care provider. Document Revised: 12/14/2020 Document Reviewed: 12/14/2020 Elsevier Patient Education  2023 [employer].  

## 2022-03-22 ENCOUNTER — Other Ambulatory Visit (HOSPITAL_COMMUNITY): Payer: Self-pay

## 2022-04-11 DIAGNOSIS — K5904 Chronic idiopathic constipation: Secondary | ICD-10-CM | POA: Insufficient documentation

## 2022-05-12 ENCOUNTER — Other Ambulatory Visit: Payer: Self-pay

## 2022-06-01 ENCOUNTER — Other Ambulatory Visit: Payer: Self-pay | Admitting: Nurse Practitioner

## 2022-06-01 DIAGNOSIS — I10 Essential (primary) hypertension: Secondary | ICD-10-CM

## 2022-06-02 ENCOUNTER — Other Ambulatory Visit (HOSPITAL_COMMUNITY): Payer: Self-pay

## 2022-06-02 MED ORDER — AMLODIPINE BESYLATE 5 MG PO TABS
5.0000 mg | ORAL_TABLET | Freq: Every day | ORAL | 1 refills | Status: DC
  Filled 2022-06-02: qty 90, 90d supply, fill #0
  Filled 2022-08-25: qty 90, 90d supply, fill #1

## 2022-06-21 ENCOUNTER — Ambulatory Visit (INDEPENDENT_AMBULATORY_CARE_PROVIDER_SITE_OTHER): Payer: Commercial Managed Care - PPO | Admitting: Nurse Practitioner

## 2022-06-21 ENCOUNTER — Encounter: Payer: Self-pay | Admitting: Nurse Practitioner

## 2022-06-21 VITALS — BP 120/70 | HR 93 | Temp 98.5°F | Ht 61.0 in | Wt 155.2 lb

## 2022-06-21 DIAGNOSIS — I129 Hypertensive chronic kidney disease with stage 1 through stage 4 chronic kidney disease, or unspecified chronic kidney disease: Secondary | ICD-10-CM | POA: Diagnosis not present

## 2022-06-21 DIAGNOSIS — Z0000 Encounter for general adult medical examination without abnormal findings: Secondary | ICD-10-CM | POA: Diagnosis not present

## 2022-06-21 DIAGNOSIS — N182 Chronic kidney disease, stage 2 (mild): Secondary | ICD-10-CM | POA: Diagnosis not present

## 2022-06-21 DIAGNOSIS — Z79899 Other long term (current) drug therapy: Secondary | ICD-10-CM

## 2022-06-21 DIAGNOSIS — N183 Chronic kidney disease, stage 3 unspecified: Secondary | ICD-10-CM | POA: Diagnosis not present

## 2022-06-21 LAB — POCT URINALYSIS DIPSTICK
Bilirubin, UA: NEGATIVE
Glucose, UA: NEGATIVE
Ketones, UA: NEGATIVE
Leukocytes, UA: NEGATIVE
Nitrite, UA: NEGATIVE
Protein, UA: NEGATIVE
Spec Grav, UA: <=1.005 — AB (ref 1.010–1.025)
Urobilinogen, UA: 0.2 E.U./dL
pH, UA: 6 (ref 5.0–8.0)

## 2022-06-21 NOTE — Progress Notes (Signed)
Hershal Coria Martin,acting as a Neurosurgeon for Arnette Felts, FNP.,have documented all relevant documentation on the behalf of Arnette Felts, FNP,as directed by  Arnette Felts, FNP while in the presence of Arnette Felts, FNP.   Subjective:     Patient ID: Rebecca Zamora , female    DOB: 11/10/1965 , 57 y.o.   MRN: [id number]   Chief Complaint  Patient presents with   Annual Exam    HPI  Patient presents today for HM, patient states compliance with medications and has no other concerns today. 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 She has been taking   BP Readings from Last 3 Encounters: 06/21/22 : 120/70 03/20/22 : 120/68 02/22/22 : 121/81  Wt Readings from Last 3 Encounters: 06/21/22 : 155 lb 3.2 oz (70.4 kg) 03/20/22 : 155 lb (70.3 kg) 02/22/22 : 156 lb (70.8 kg)  Her weight at home was 149 lbs     Past Medical History:  Diagnosis Date   Atypical chest pain 06/24/2018   Cardiomegaly    HTN (hypertension)    No pertinent past medical history      Family History  Problem Relation Age of Onset   Multiple sclerosis Mother    Hypertension Father    Stroke Father    Gout Father    Multiple sclerosis Sister    Stroke Sister    Gout Brother    Hypertension Brother    Healthy Daughter    Healthy Son    Other Neg Hx    Diabetes Neg Hx      Current Outpatient Medications:    amLODipine (NORVASC) 5 MG tablet, Take 1 tablet (5 mg total) by mouth daily., Disp: 90 tablet, Rfl: 1   Blood Pressure Monitoring (OMRON 3 SERIES BP MONITOR) DEVI, Use as directed, Disp: 1 each, Rfl: 0   cyclobenzaprine (FLEXERIL) 10 MG tablet, Take 1 tablet (10 mg total) by mouth 3 (three) times daily as needed for muscle spasms., Disp: 30 tablet, Rfl: 2   EPINEPHrine 0.3 mg/0.3 mL IJ SOAJ injection, Inject 0.3 mg into the muscle as needed for anaphylaxis., Disp: 2 each, Rfl: 1   triamcinolone ointment (KENALOG) 0.1 %, Apply 1 application topically 2 (two) times daily as needed. Avoid the face.,  Disp: 454 g, Rfl: 1   VITAMIN D PO, Take 2,000 Units by mouth daily., Disp: , Rfl:    Allergies  Allergen Reactions   Nifedipine Other (See Comments)    Depression, nausea, fatigue   Hydrocodone Nausea Only      The patient states she is status post hysterectomy.  Patient's last menstrual period was 01/21/2011.. Negative for: breast discharge, breast lump(s), breast pain and breast self exam. Associated symptoms include abnormal vaginal bleeding. Pertinent negatives include abnormal bleeding (hematology), anxiety, decreased libido, depression, difficulty falling sleep, dyspareunia, history of infertility, nocturia, sexual dysfunction, sleep disturbances, urinary incontinence, urinary urgency, vaginal discharge and vaginal itching. Diet regular; drinks at least 1 liter of water a day. The patient states her exercise level is 6-7 days a week.    . The patient's tobacco use is:  Social History   Tobacco Use  Smoking Status Never   Passive exposure: Never  Smokeless Tobacco Never  . She has been exposed to passive smoke. The patient's alcohol use is:  Social History   Substance and Sexual Activity  Alcohol Use Not Currently  . Additional information: Last pap 06/16/2021, next one scheduled for 06/16/2024.    Review of Systems  Constitutional: Negative.  HENT: Negative.    Eyes: Negative.   Respiratory: Negative.    Cardiovascular: Negative.   Gastrointestinal: Negative.   Endocrine: Negative.   Genitourinary: Negative.   Musculoskeletal: Negative.   Skin: Negative.   Allergic/Immunologic: Negative.   Neurological: Negative.   Hematological: Negative.   Psychiatric/Behavioral: Negative.       Today's Vitals   06/21/22 1535  BP: 120/70  Pulse: 93  Temp: 98.5 F (36.9 C)  TempSrc: Oral  Weight: 155 lb 3.2 oz (70.4 kg)  Height: 5\' 1"  (1.549 m)  PainSc: 0-No pain   Body mass index is 29.32 kg/m.  Wt Readings from Last 3 Encounters:  06/21/22 155 lb 3.2 oz (70.4 kg)   03/20/22 155 lb (70.3 kg)  02/22/22 156 lb (70.8 kg)    Objective:  Physical Exam Vitals reviewed. Exam conducted with a chaperone present.  Constitutional:      General: She is not in acute distress.    Appearance: Normal appearance. She is well-developed.  HENT:     Head: Normocephalic and atraumatic.     Right Ear: Hearing, tympanic membrane, ear canal and external ear normal. There is no impacted cerumen.     Left Ear: Hearing, tympanic membrane, ear canal and external ear normal. There is no impacted cerumen.     Nose: Nose normal.     Mouth/Throat:     Mouth: Mucous membranes are moist.  Eyes:     General: Lids are normal.     Extraocular Movements: Extraocular movements intact.     Conjunctiva/sclera: Conjunctivae normal.     Pupils: Pupils are equal, round, and reactive to light.     Funduscopic exam:    Right eye: No papilledema.        Left eye: No papilledema.  Neck:     Thyroid: No thyroid mass.     Vascular: No carotid bruit.  Cardiovascular:     Rate and Rhythm: Normal rate and regular rhythm.     Pulses: Normal pulses.     Heart sounds: Normal heart sounds. No murmur heard. Pulmonary:     Effort: Pulmonary effort is normal. No respiratory distress.     Breath sounds: Normal breath sounds. No wheezing.  Abdominal:     General: Abdomen is flat. Bowel sounds are normal. There is no distension.     Palpations: Abdomen is soft.     Hernia: There is no hernia in the left inguinal area or right inguinal area.  Genitourinary:    Pubic Area: No rash.      Labia:        Right: No rash or tenderness.        Left: No rash or tenderness.      Vagina: Normal.     Cervix: Normal.     Uterus: Absent.   Musculoskeletal:        General: No swelling. Normal range of motion.     Cervical back: Full passive range of motion without pain, normal range of motion and neck supple. No rigidity.     Right lower leg: No edema.     Left lower leg: No edema.  Skin:    General:  Skin is warm and dry.     Capillary Refill: Capillary refill takes less than 2 seconds.  Neurological:     General: No focal deficit present.     Mental Status: She is alert and oriented to person, place, and time.     Cranial Nerves: No cranial nerve  deficit.     Sensory: No sensory deficit.     Motor: No weakness.  Psychiatric:        Mood and Affect: Mood normal.        Behavior: Behavior normal.        Thought Content: Thought content normal.        Judgment: Judgment normal.         Assessment And Plan:     1. Annual physical exam Behavior modifications discussed and diet history reviewed.   Pt will continue to exercise regularly and modify diet with low GI, plant based foods and decrease intake of processed foods.  Recommend intake of daily multivitamin, Vitamin D, and calcium.  Recommend mammogram and colonoscopy for preventive screenings, as well as recommend immunizations that include influenza, TDAP, and Shingles  2. Benign hypertension with CKD (chronic kidney disease), stage II Comments: Blood pressure is well controlled. Continue current medications. Explained importance of EKG with hypertension and was done, Sinus Bradycardia HR 52 - Microalbumin / creatinine urine ratio - POCT urinalysis dipstick - CMP14+EGFR - Lipid panel - EKG 12-Lead  3. Other long term (current) drug therapy - CBC   Patient was given opportunity to ask questions. Patient verbalized understanding of the plan and was able to repeat key elements of the plan. All questions were answered to their satisfaction.   Arnette Felts, FNP   I, Arnette Felts, FNP, have reviewed all documentation for this visit. The documentation on 06/21/22 for the exam, diagnosis, procedures, and orders are all accurate and complete.   THE PATIENT IS ENCOURAGED TO PRACTICE SOCIAL DISTANCING DUE TO THE COVID-19 PANDEMIC.

## 2022-06-21 NOTE — Patient Instructions (Signed)

## 2022-06-22 LAB — CBC
Hematocrit: 40 % (ref 34.0–46.6)
Hemoglobin: 13.3 g/dL (ref 11.1–15.9)
MCH: 28.8 pg (ref 26.6–33.0)
MCHC: 33.3 g/dL (ref 31.5–35.7)
MCV: 87 fL (ref 79–97)
Platelets: 225 10*3/uL (ref 150–450)
RBC: 4.62 x10E6/uL (ref 3.77–5.28)
RDW: 13.3 % (ref 11.7–15.4)
WBC: 4 10*3/uL (ref 3.4–10.8)

## 2022-06-22 LAB — CMP14+EGFR
ALT: 21 IU/L (ref 0–32)
AST: 33 IU/L (ref 0–40)
Albumin/Globulin Ratio: 1.7 (ref 1.2–2.2)
Albumin: 4.3 g/dL (ref 3.8–4.9)
Alkaline Phosphatase: 100 IU/L (ref 44–121)
BUN/Creatinine Ratio: 13 (ref 9–23)
BUN: 15 mg/dL (ref 6–24)
Bilirubin Total: 0.5 mg/dL (ref 0.0–1.2)
CO2: 24 mmol/L (ref 20–29)
Calcium: 9.4 mg/dL (ref 8.7–10.2)
Chloride: 104 mmol/L (ref 96–106)
Creatinine, Ser: 1.15 mg/dL — ABNORMAL HIGH (ref 0.57–1.00)
Globulin, Total: 2.5 g/dL (ref 1.5–4.5)
Glucose: 77 mg/dL (ref 70–99)
Potassium: 3.9 mmol/L (ref 3.5–5.2)
Sodium: 143 mmol/L (ref 134–144)
Total Protein: 6.8 g/dL (ref 6.0–8.5)
eGFR: 56 mL/min/{1.73_m2} — ABNORMAL LOW (ref >59)

## 2022-06-22 LAB — LIPID PANEL
Chol/HDL Ratio: 2.4 ratio (ref 0.0–4.4)
Cholesterol, Total: 187 mg/dL (ref 100–199)
HDL: 78 mg/dL (ref >39)
LDL Chol Calc (NIH): 99 mg/dL (ref 0–99)
Triglycerides: 51 mg/dL (ref 0–149)
VLDL Cholesterol Cal: 10 mg/dL (ref 5–40)

## 2022-06-22 LAB — MICROALBUMIN / CREATININE URINE RATIO
Creatinine, Urine: 60.9 mg/dL
Microalb/Creat Ratio: <5 mg/g creat (ref 0–29)
Microalbumin, Urine: <3 ug/mL

## 2022-07-05 DIAGNOSIS — I129 Hypertensive chronic kidney disease with stage 1 through stage 4 chronic kidney disease, or unspecified chronic kidney disease: Secondary | ICD-10-CM | POA: Insufficient documentation

## 2022-08-01 ENCOUNTER — Ambulatory Visit: Payer: Commercial Managed Care - PPO | Admitting: Family Medicine

## 2022-08-01 ENCOUNTER — Encounter: Payer: Self-pay | Admitting: Family Medicine

## 2022-08-01 VITALS — BP 120/80 | HR 68 | Temp 98.4°F | Ht 61.0 in | Wt 154.0 lb

## 2022-08-01 DIAGNOSIS — I129 Hypertensive chronic kidney disease with stage 1 through stage 4 chronic kidney disease, or unspecified chronic kidney disease: Secondary | ICD-10-CM | POA: Diagnosis not present

## 2022-08-01 DIAGNOSIS — N182 Chronic kidney disease, stage 2 (mild): Secondary | ICD-10-CM

## 2022-08-01 DIAGNOSIS — I10 Essential (primary) hypertension: Secondary | ICD-10-CM

## 2022-08-01 DIAGNOSIS — R531 Weakness: Secondary | ICD-10-CM

## 2022-08-01 NOTE — Progress Notes (Signed)
I,Lindia Garms,acting as a scribe for [employer], NP.,have documented all relevant documentation on the behalf of Selinda Korzeniewski, NP,as directed by  Mehdi Gironda Moshe Salisbury, NP while in the presence of Toniesha Zellner [facility], NP.  Subjective:  Patient ID: Rebecca Zamora , female    DOB: Mar 05, 1965 , 57 y.o.   MRN: [id number]  Chief Complaint  Patient presents with   feeling weak   numbness on face    HPI  Patient reports she has been feeling weak for two weeks. She also complains of numbness in the right side of her face. She is concerned about her GFR and kidney function.  Discussed her last lab results with her, explained to  her that her eGFR went from 44 to 71. She reported she had lost weight because she was not eating, but her appetite has gotten better in the past few days. She reported she weighed 149lbs 2 days ago but today she weighs 154 lbs because her appetite is back. Suggested lab draws to check electrolytes and GFR, patient states she was okay because she will be seeing the nephrologist in about a week and will get it drawn there  so she there was no need getting labs today.     Past Medical History:  Diagnosis Date   Atypical chest pain 06/24/2018   Cardiomegaly    HTN (hypertension)    No pertinent past medical history      Family History  Problem Relation Age of Onset   Multiple sclerosis Mother    Hypertension Father    Stroke Father    Gout Father    Multiple sclerosis Sister    Stroke Sister    Gout Brother    Hypertension Brother    Healthy Daughter    Healthy Son    Other Neg Hx    Diabetes Neg Hx      Current Outpatient Medications:    amLODipine (NORVASC) 5 MG tablet, Take 1 tablet (5 mg total) by mouth daily., Disp: 90 tablet, Rfl: 1   Blood Pressure Monitoring (OMRON 3 SERIES BP MONITOR) DEVI, Use as directed, Disp: 1 each, Rfl: 0   cyclobenzaprine (FLEXERIL) 10 MG tablet, Take 1 tablet (10 mg total) by mouth 3 (three) times daily as needed for muscle spasms., Disp: 30  tablet, Rfl: 2   EPINEPHrine 0.3 mg/0.3 mL IJ SOAJ injection, Inject 0.3 mg into the muscle as needed for anaphylaxis., Disp: 2 each, Rfl: 1   triamcinolone ointment (KENALOG) 0.1 %, Apply 1 application topically 2 (two) times daily as needed. Avoid the face., Disp: 454 g, Rfl: 1   VITAMIN D PO, Take 2,000 Units by mouth daily., Disp: , Rfl:    Allergies  Allergen Reactions   Nifedipine Other (See Comments)    Depression, nausea, fatigue   Hydrocodone Nausea Only     Review of Systems  Constitutional: Negative.  Negative for appetite change.  HENT: Negative.    Respiratory: Negative.    Cardiovascular: Negative.   Genitourinary:  Negative for urgency.  Musculoskeletal: Negative.   Skin: Negative.   Neurological: Negative.   Psychiatric/Behavioral: Negative.       Today's Vitals   08/01/22 1550  BP: 120/80  Pulse: 68  Temp: 98.4 F (36.9 C)  Weight: 154 lb (69.9 kg)  Height: 5\' 1"  (1.549 m)  PainSc: 0-No pain   Body mass index is 29.1 kg/m.  Wt Readings from Last 3 Encounters:  08/01/22 154 lb (69.9 kg)  06/21/22 155 lb 3.2 oz (  70.4 kg)  03/20/22 155 lb (70.3 kg)     Physical Exam Constitutional:      Appearance: Normal appearance.  HENT:     Head: Normocephalic.  Pulmonary:     Effort: Pulmonary effort is normal. No respiratory distress.  Skin:    General: Skin is warm and dry.  Neurological:     General: No focal deficit present.     Mental Status: She is alert and oriented to person, place, and time. Mental status is at baseline.  Psychiatric:        Mood and Affect: Mood normal.         Assessment And Plan:  1. Weakness Comments: Symptom has resolved some days ago after she got her appetite back  2. Benign hypertension with CKD (chronic kidney disease), stage II Comments: Blood pressure under control, CKD stable with eGFR 56 , continue current treatment. Has Nephrology appt soon per patient( in a week)    Return if symptoms worsen or fail to  improve, for keep up previously scheduled appt for blood pressure.  Patient was given opportunity to ask questions. Patient verbalized understanding of the plan and was able to repeat key elements of the plan. All questions were answered to their satisfaction.  Tzipora Mcinroy Moshe Salisbury, NP  I, Shaine Mount Moshe Salisbury, NP, have reviewed all documentation for this visit. The documentation on 08/07/22 for the exam, diagnosis, procedures, and orders are all accurate and complete.   IF YOU HAVE BEEN REFERRED TO A SPECIALIST, IT MAY TAKE 1-2 WEEKS TO SCHEDULE/PROCESS THE REFERRAL. IF YOU HAVE NOT HEARD FROM US/SPECIALIST IN TWO WEEKS, PLEASE GIVE [place] A CALL AT [phone] X [phone].

## 2022-08-03 DIAGNOSIS — R531 Weakness: Secondary | ICD-10-CM | POA: Insufficient documentation

## 2022-08-03 HISTORY — DX: Weakness: R53.1

## 2022-08-03 NOTE — Assessment & Plan Note (Addendum)
Blood pressure under control, CKD stable with eGFR 56 , continue current treatment. Has Nephrology appt soon per patient( in a week)

## 2022-08-03 NOTE — Assessment & Plan Note (Addendum)
Symptom has resolved some days ago after she got her appetite back

## 2022-08-17 DIAGNOSIS — Z130 Encounter for screening for diseases of the blood and blood-forming organs and certain disorders involving the immune mechanism: Secondary | ICD-10-CM | POA: Diagnosis not present

## 2022-08-17 DIAGNOSIS — Z1231 Encounter for screening mammogram for malignant neoplasm of breast: Secondary | ICD-10-CM | POA: Diagnosis not present

## 2022-08-17 DIAGNOSIS — Z780 Asymptomatic menopausal state: Secondary | ICD-10-CM | POA: Diagnosis not present

## 2022-08-17 DIAGNOSIS — Z01419 Encounter for gynecological examination (general) (routine) without abnormal findings: Secondary | ICD-10-CM | POA: Diagnosis not present

## 2022-08-17 DIAGNOSIS — Z1389 Encounter for screening for other disorder: Secondary | ICD-10-CM | POA: Diagnosis not present

## 2022-08-22 DIAGNOSIS — N1831 Chronic kidney disease, stage 3a: Secondary | ICD-10-CM | POA: Diagnosis not present

## 2022-08-22 DIAGNOSIS — E876 Hypokalemia: Secondary | ICD-10-CM | POA: Diagnosis not present

## 2022-08-22 DIAGNOSIS — I129 Hypertensive chronic kidney disease with stage 1 through stage 4 chronic kidney disease, or unspecified chronic kidney disease: Secondary | ICD-10-CM | POA: Diagnosis not present

## 2022-08-31 ENCOUNTER — Ambulatory Visit: Payer: Commercial Managed Care - PPO | Admitting: Family Medicine

## 2022-08-31 VITALS — BP 110/70 | HR 84 | Temp 98.5°F | Ht 61.2 in | Wt 151.4 lb

## 2022-08-31 DIAGNOSIS — K5909 Other constipation: Secondary | ICD-10-CM

## 2022-08-31 DIAGNOSIS — R102 Pelvic and perineal pain: Secondary | ICD-10-CM | POA: Diagnosis not present

## 2022-08-31 DIAGNOSIS — R109 Unspecified abdominal pain: Secondary | ICD-10-CM

## 2022-08-31 LAB — POCT URINALYSIS DIPSTICK
Blood, UA: NEGATIVE
Glucose, UA: NEGATIVE
Leukocytes, UA: NEGATIVE
Nitrite, UA: NEGATIVE
Protein, UA: POSITIVE — AB
Spec Grav, UA: >=1.03 — AB (ref 1.010–1.025)
Urobilinogen, UA: 0.2 E.U./dL
pH, UA: 5.5 (ref 5.0–8.0)

## 2022-08-31 NOTE — Progress Notes (Signed)
I,Jameka J Llittleton, CMA,acting as a Neurosurgeon for [employer], NP.,have documented all relevant documentation on the behalf of Kendrea Cerritos, NP,as directed by  Whyatt Klinger Moshe Salisbury, NP while in the presence of Brandan Robicheaux, NP.  Subjective:  Patient ID: Rebecca Zamora , female    DOB: [birth year] , 57 y.o.   MRN: [id number]  Chief Complaint  Patient presents with   Abdominal Pain    HPI  Patient presents today for stomach cramps/pelvic pain. She stated she did go to her GYN for this awhile ago and they told her to go to a GI doctor. Patient states she  had her colonoscopy done by Dr Elnoria [facility] about 5-6 years ago who gave her something to take daily for constipation. Patient states she has not been taking them because of the cost.  Abdominal Pain This is a new problem. The current episode started more than 1 month ago. The problem occurs intermittently. The problem has been unchanged. The pain is located in the LUQ and RLQ. The pain is moderate. The quality of the pain is cramping. Associated symptoms include constipation. Pertinent negatives include no belching, frequency, hematuria, nausea or vomiting. Nothing aggravates the pain. Prior diagnostic workup includes GI consult.     Past Medical History:  Diagnosis Date   Atypical chest pain 06/24/2018   Cardiomegaly    HTN (hypertension)    No pertinent past medical history      Family History  Problem Relation Age of Onset   Multiple sclerosis Mother    Hypertension Father    Stroke Father    Gout Father    Multiple sclerosis Sister    Stroke Sister    Gout Brother    Hypertension Brother    Healthy Daughter    Healthy Son    Other Neg Hx    Diabetes Neg Hx      Current Outpatient Medications:    amLODipine (NORVASC) 5 MG tablet, Take 1 tablet (5 mg total) by mouth daily., Disp: 90 tablet, Rfl: 1   Blood Pressure Monitoring (OMRON 3 SERIES BP MONITOR) DEVI, Use as directed, Disp: 1 each, Rfl: 0   cyclobenzaprine (FLEXERIL) 10 MG  tablet, Take 1 tablet (10 mg total) by mouth 3 (three) times daily as needed for muscle spasms., Disp: 30 tablet, Rfl: 2   EPINEPHrine 0.3 mg/0.3 mL IJ SOAJ injection, Inject 0.3 mg into the muscle as needed for anaphylaxis., Disp: 2 each, Rfl: 1   linaclotide (LINZESS) 72 MCG capsule, Take 1 capsule (72 mcg total) by mouth daily before breakfast., Disp: 30 capsule, Rfl: 5   triamcinolone ointment (KENALOG) 0.1 %, Apply 1 application topically 2 (two) times daily as needed. Avoid the face., Disp: 454 g, Rfl: 1   VITAMIN D PO, Take 2,000 Units by mouth daily., Disp: , Rfl:    Allergies  Allergen Reactions   Nifedipine Other (See Comments)    Depression, nausea, fatigue   Hydrocodone Nausea Only     Review of Systems  HENT: Negative.    Respiratory: Negative.    Gastrointestinal:  Positive for abdominal pain and constipation. Negative for blood in stool, nausea and vomiting.  Endocrine: Negative.   Genitourinary:  Positive for pelvic pain. Negative for frequency and hematuria.     Today's Vitals   08/31/22 1554  BP: 110/70  Pulse: 84  Temp: 98.5 F (36.9 C)  Weight: 151 lb 6.4 oz (68.7 kg)  Height: 5' 1.2" (1.554 m)  PainSc: 7   PainLoc: Abdomen  Body mass index is 28.42 kg/m.  Wt Readings from Last 3 Encounters:  08/31/22 151 lb 6.4 oz (68.7 kg)  08/01/22 154 lb (69.9 kg)  06/21/22 155 lb 3.2 oz (70.4 kg)     Objective:  Physical Exam HENT:     Head: Normocephalic.  Abdominal:     General: Abdomen is flat. Bowel sounds are normal.  Genitourinary:    Uterus: Absent.      Comments: Has had a hysterectomy Skin:    General: Skin is warm and dry.  Neurological:     Mental Status: She is alert and oriented to person, place, and time.         Assessment And Plan:  Abdominal cramps -     POCT urinalysis dipstick  Acute pelvic pain -     US PELVIC COMPLETE WITH TRANSVAGINAL; Future  Other constipation Assessment & Plan: Gave samples of linzess in the  office  Orders: -     linaCLOtide; Take 1 capsule (72 mcg total) by mouth daily before breakfast.  Dispense: 30 capsule; Refill: 5     Return if symptoms worsen or fail to improve.  Patient was given opportunity to ask questions. Patient verbalized understanding of the plan and was able to repeat key elements of the plan. All questions were answered to their satisfaction.  Venus Gilles Moshe Salisbury, NP  I, Shakira Los Moshe Salisbury, NP, have reviewed all documentation for this visit. The documentation on 09/14/22 for the exam, diagnosis, procedures, and orders are all accurate and complete.   IF YOU HAVE BEEN REFERRED TO A SPECIALIST, IT MAY TAKE 1-2 WEEKS TO SCHEDULE/PROCESS THE REFERRAL. IF YOU HAVE NOT HEARD FROM US/SPECIALIST IN TWO WEEKS, PLEASE GIVE [place] A CALL AT [phone].   THE PATIENT IS ENCOURAGED TO PRACTICE SOCIAL DISTANCING DUE TO THE COVID-19 PANDEMIC.

## 2022-09-14 ENCOUNTER — Encounter: Payer: Self-pay | Admitting: Family Medicine

## 2022-09-14 ENCOUNTER — Other Ambulatory Visit (HOSPITAL_COMMUNITY): Payer: Self-pay

## 2022-09-14 DIAGNOSIS — K5909 Other constipation: Secondary | ICD-10-CM | POA: Insufficient documentation

## 2022-09-14 DIAGNOSIS — R109 Unspecified abdominal pain: Secondary | ICD-10-CM | POA: Insufficient documentation

## 2022-09-14 MED ORDER — LINACLOTIDE 72 MCG PO CAPS
72.0000 ug | ORAL_CAPSULE | Freq: Every day | ORAL | 5 refills | Status: DC
Start: 2022-09-14 — End: 2023-06-25
  Filled 2022-09-14: qty 30, 30d supply, fill #0

## 2022-09-14 NOTE — Assessment & Plan Note (Signed)
Gave samples of linzess in the office

## 2022-09-15 ENCOUNTER — Other Ambulatory Visit (HOSPITAL_COMMUNITY): Payer: Self-pay

## 2022-10-31 ENCOUNTER — Other Ambulatory Visit: Payer: Self-pay | Admitting: Nurse Practitioner

## 2022-10-31 ENCOUNTER — Other Ambulatory Visit (HOSPITAL_COMMUNITY): Payer: Self-pay

## 2022-10-31 DIAGNOSIS — I10 Essential (primary) hypertension: Secondary | ICD-10-CM

## 2022-10-31 MED ORDER — AMLODIPINE BESYLATE 5 MG PO TABS
5.0000 mg | ORAL_TABLET | Freq: Every day | ORAL | 1 refills | Status: DC
Start: 2022-10-31 — End: 2023-05-29
  Filled 2022-10-31 – 2022-11-20 (×2): qty 90, 90d supply, fill #0
  Filled 2023-02-22: qty 90, 90d supply, fill #1

## 2022-11-20 ENCOUNTER — Other Ambulatory Visit (HOSPITAL_COMMUNITY): Payer: Self-pay

## 2022-12-25 ENCOUNTER — Other Ambulatory Visit (HOSPITAL_COMMUNITY): Payer: Self-pay

## 2022-12-25 ENCOUNTER — Ambulatory Visit: Payer: Commercial Managed Care - PPO | Admitting: Nurse Practitioner

## 2022-12-25 ENCOUNTER — Encounter: Payer: Self-pay | Admitting: Nurse Practitioner

## 2022-12-25 VITALS — BP 110/72 | HR 68 | Temp 98.5°F | Ht 61.0 in | Wt 154.2 lb

## 2022-12-25 DIAGNOSIS — Z2821 Immunization not carried out because of patient refusal: Secondary | ICD-10-CM | POA: Diagnosis not present

## 2022-12-25 DIAGNOSIS — M791 Myalgia, unspecified site: Secondary | ICD-10-CM | POA: Insufficient documentation

## 2022-12-25 DIAGNOSIS — J3489 Other specified disorders of nose and nasal sinuses: Secondary | ICD-10-CM | POA: Diagnosis not present

## 2022-12-25 DIAGNOSIS — I10 Essential (primary) hypertension: Secondary | ICD-10-CM | POA: Diagnosis not present

## 2022-12-25 MED ORDER — CYCLOBENZAPRINE HCL 10 MG PO TABS
10.0000 mg | ORAL_TABLET | Freq: Three times a day (TID) | ORAL | 2 refills | Status: DC | PRN
  Filled 2022-12-25: qty 30, 10d supply, fill #0
  Filled 2023-06-01: qty 30, 10d supply, fill #1
  Filled 2023-12-06: qty 30, 10d supply, fill #2

## 2022-12-25 NOTE — Progress Notes (Signed)
Madelaine Bhat, CMA,acting as a Neurosurgeon for Arnette Felts, FNP.,have documented all relevant documentation on the behalf of Arnette Felts, FNP,as directed by  Arnette Felts, FNP while in the presence of Arnette Felts, FNP.  Subjective:  Patient ID: Rebecca Zamora , female    DOB: [birth year] , 57 y.o.   MRN: [id number]  Chief Complaint  Patient presents with   Hypertension    HPI  Patient presents today for a bp follow up, Patient reports compliance with medication. Patient denies any chest pain, SOB, or headaches. Patient reports she has sinus pressure/pain that has been present for about a day or 2. Patient reports she got her flu shot at [facility] but didn't bring her card. She is taking an antihistamine and a nasal spray for her allergies.   BP Readings from Last 3 Encounters: 12/25/22 : 110/72 08/31/22 : 110/70 08/01/22 : 120/80       Past Medical History:  Diagnosis Date   Acute pelvic pain 01/23/2011   Atypical chest pain 06/24/2018   Cardiomegaly    HTN (hypertension)    No pertinent past medical history      Family History  Problem Relation Age of Onset   Multiple sclerosis Mother    Hypertension Father    Stroke Father    Gout Father    Multiple sclerosis Sister    Stroke Sister    Gout Brother    Hypertension Brother    Healthy Daughter    Healthy Son    Other Neg Hx    Diabetes Neg Hx      Current Outpatient Medications:    amLODipine (NORVASC) 5 MG tablet, Take 1 tablet (5 mg total) by mouth daily., Disp: 90 tablet, Rfl: 1   Blood Pressure Monitoring (OMRON 3 SERIES BP MONITOR) DEVI, Use as directed, Disp: 1 each, Rfl: 0   EPINEPHrine 0.3 mg/0.3 mL IJ SOAJ injection, Inject 0.3 mg into the muscle as needed for anaphylaxis., Disp: 2 each, Rfl: 1   linaclotide (LINZESS) 72 MCG capsule, Take 1 capsule (72 mcg total) by mouth daily before breakfast., Disp: 30 capsule, Rfl: 5   triamcinolone ointment (KENALOG) 0.1 %, Apply 1 application topically 2 (two) times  daily as needed. Avoid the face., Disp: 454 g, Rfl: 1   VITAMIN D PO, Take 2,000 Units by mouth daily., Disp: , Rfl:    cyclobenzaprine (FLEXERIL) 10 MG tablet, Take 1 tablet (10 mg total) by mouth 3 (three) times daily as needed for muscle spasms., Disp: 30 tablet, Rfl: 2   Allergies  Allergen Reactions   Nifedipine Other (See Comments)    Depression, nausea, fatigue   Hydrocodone Nausea Only     Review of Systems  Constitutional: Negative.   HENT: Negative.    Eyes: Negative.   Respiratory: Negative.    Cardiovascular: Negative.   Gastrointestinal: Negative.   Musculoskeletal: Negative.   Neurological: Negative.   Psychiatric/Behavioral: Negative.       Today's Vitals   12/25/22 1534  BP: 110/72  Pulse: 68  Temp: 98.5 F (36.9 C)  TempSrc: Oral  Weight: 154 lb 3.2 oz (69.9 kg)  Height: 5\' 1"  (1.549 m)  PainSc: 0-No pain   Body mass index is 29.14 kg/m.  Wt Readings from Last 3 Encounters:  12/25/22 154 lb 3.2 oz (69.9 kg)  08/31/22 151 lb 6.4 oz (68.7 kg)  08/01/22 154 lb (69.9 kg)      Objective:  Physical Exam Vitals reviewed.  Constitutional:  General: She is not in acute distress.    Appearance: Normal appearance. She is obese.  HENT:     Right Ear: Tympanic membrane, ear canal and external ear normal. There is no impacted cerumen.     Left Ear: Tympanic membrane, ear canal and external ear normal. There is no impacted cerumen.     Nose: Nose normal. No congestion.     Right Sinus: No maxillary sinus tenderness or frontal sinus tenderness.     Left Sinus: No maxillary sinus tenderness or frontal sinus tenderness.  Cardiovascular:     Rate and Rhythm: Normal rate and regular rhythm.     Pulses: Normal pulses.     Heart sounds: Normal heart sounds. No murmur heard. Pulmonary:     Effort: Pulmonary effort is normal. No respiratory distress.     Breath sounds: Normal breath sounds. No wheezing.  Skin:    General: Skin is warm and dry.      Capillary Refill: Capillary refill takes less than 2 seconds.  Neurological:     General: No focal deficit present.     Mental Status: She is alert and oriented to person, place, and time.     Cranial Nerves: No cranial nerve deficit.     Motor: No weakness.  Psychiatric:        Mood and Affect: Mood normal.        Behavior: Behavior normal.        Thought Content: Thought content normal.        Judgment: Judgment normal.         Assessment And Plan:  Essential hypertension Assessment & Plan: Blood pressure is well-controlled.  Continue current medications  Orders: -     BMP8+eGFR  Muscle ache Assessment & Plan: Continues to use muscle relaxer as needed for muscle aches  Orders: -     Cyclobenzaprine HCl; Take 1 tablet (10 mg total) by mouth 3 (three) times daily as needed for muscle spasms.  Dispense: 30 tablet; Refill: 2  Sinus pressure Assessment & Plan: Has only been 2 to 3-day history of sinus pressure advised to use nasal spray steroid.  No tenderness to her sinus areas   COVID-19 vaccination declined Assessment & Plan: Declines covid 19 vaccine. Discussed risk of covid 81 and if she changes her mind about the vaccine to call the office. Education has been provided regarding the importance of this vaccine but patient still declined. Advised may receive this vaccine at local pharmacy or Health Dept.or vaccine clinic. Aware to provide a copy of the vaccination record if obtained from local pharmacy or Health Dept.  Encouraged to take multivitamin, vitamin d, vitamin c and zinc to increase immune system. Aware can call office if would like to have vaccine here at office. Verbalized acceptance and understanding.      Return if symptoms worsen or fail to improve, for keep same next appt. .  Patient was given opportunity to ask questions. Patient verbalized understanding of the plan and was able to repeat key elements of the plan. All questions were answered to their  satisfaction.    Rebecca Sparrow, FNP, have reviewed all documentation for this visit. The documentation on 12/25/22 for the exam, diagnosis, procedures, and orders are all accurate and complete.   IF YOU HAVE BEEN REFERRED TO A SPECIALIST, IT MAY TAKE 1-2 WEEKS TO SCHEDULE/PROCESS THE REFERRAL. IF YOU HAVE NOT HEARD FROM US/SPECIALIST IN TWO WEEKS, PLEASE GIVE [place] A CALL AT [phone].

## 2022-12-26 LAB — BMP8+EGFR
BUN/Creatinine Ratio: 18 (ref 9–23)
BUN: 20 mg/dL (ref 6–24)
CO2: 24 mmol/L (ref 20–29)
Calcium: 9.5 mg/dL (ref 8.7–10.2)
Chloride: 103 mmol/L (ref 96–106)
Creatinine, Ser: 1.13 mg/dL — ABNORMAL HIGH (ref 0.57–1.00)
Glucose: 81 mg/dL (ref 70–99)
Potassium: 4.4 mmol/L (ref 3.5–5.2)
Sodium: 143 mmol/L (ref 134–144)
eGFR: 57 mL/min/{1.73_m2} — ABNORMAL LOW (ref >59)

## 2022-12-29 ENCOUNTER — Other Ambulatory Visit (HOSPITAL_COMMUNITY): Payer: Self-pay

## 2022-12-29 MED ORDER — AZITHROMYCIN 250 MG PO TABS
ORAL_TABLET | ORAL | 0 refills | Status: AC
  Filled 2022-12-29: qty 6, 5d supply, fill #0

## 2023-01-04 ENCOUNTER — Encounter: Payer: Self-pay | Admitting: Nurse Practitioner

## 2023-01-04 NOTE — Assessment & Plan Note (Signed)
Blood pressure is well controlled. Continue current medications. 

## 2023-01-04 NOTE — Assessment & Plan Note (Signed)
>>  ASSESSMENT AND PLAN FOR ESSENTIAL HYPERTENSION WRITTEN ON 01/04/2023  8:17 PM BY [name], FNP  Blood pressure is well-controlled.  Continue current medications

## 2023-01-04 NOTE — Assessment & Plan Note (Signed)
Has only been 2 to 3-day history of sinus pressure advised to use nasal spray steroid.  No tenderness to her sinus areas

## 2023-01-04 NOTE — Assessment & Plan Note (Signed)
Continues to use muscle relaxer as needed for muscle aches

## 2023-01-04 NOTE — Assessment & Plan Note (Signed)

## 2023-01-24 ENCOUNTER — Other Ambulatory Visit (HOSPITAL_COMMUNITY): Payer: Self-pay

## 2023-02-15 NOTE — Progress Notes (Deleted)
 Office Visit Note  Patient: Rebecca Zamora             Date of Birth: 11-10-1965           MRN: [id number]             PCP: Arnette Felts, FNP Referring: Arnette Felts, FNP Visit Date: 02/28/2023 Occupation: @GUAROCC @  Subjective:  No chief complaint on file.   History of Present Illness: Rebecca Zamora is a 57 y.o. female ***     Activities of Daily Living:  Patient reports morning stiffness for *** {minute/hour:19697}.   Patient {ACTIONS;DENIES/REPORTS:21021675::"Denies"} nocturnal pain.  Difficulty dressing/grooming: {ACTIONS;DENIES/REPORTS:21021675::"Denies"} Difficulty climbing stairs: {ACTIONS;DENIES/REPORTS:21021675::"Denies"} Difficulty getting out of chair: {ACTIONS;DENIES/REPORTS:21021675::"Denies"} Difficulty using hands for taps, buttons, cutlery, and/or writing: {ACTIONS;DENIES/REPORTS:21021675::"Denies"}  No Rheumatology ROS completed.   PMFS History:  Patient Active Problem List   Diagnosis Date Noted   Sinus pressure 12/25/2022   Muscle ache 12/25/2022   COVID-19 vaccination declined 12/25/2022   Abdominal cramps 09/14/2022   Other constipation 09/14/2022   Weakness 08/03/2022   Benign hypertension with CKD (chronic kidney disease), stage II 07/05/2022   Chronic idiopathic constipation 04/11/2022   Blood clotting disorder (HCC) 03/20/2022   Primary osteoarthritis of both hands 02/22/2022   DDD (degenerative disc disease), cervical 02/22/2022   Mitral late systolic murmur 07/02/2018   Cardiomegaly    Essential hypertension 06/10/2018   Hypoglycemia 03/27/2018   Fibroid uterus 01/23/2011    Past Medical History:  Diagnosis Date   Acute pelvic pain 01/23/2011   Atypical chest pain 06/24/2018   Cardiomegaly    HTN (hypertension)    No pertinent past medical history     Family History  Problem Relation Age of Onset   Multiple sclerosis Mother    Hypertension Father    Stroke Father    Gout Father    Multiple sclerosis Sister    Stroke  Sister    Gout Brother    Hypertension Brother    Healthy Daughter    Healthy Son    Other Neg Hx    Diabetes Neg Hx    Past Surgical History:  Procedure Laterality Date   MYOMECTOMY  1999   Social History   Social History Narrative   Not on file   Immunization History  Administered Date(s) Administered   Influenza,inj,Quad PF,6+ Mos 11/19/2021   Influenza-Unspecified 12/05/2017, 11/21/2019   PFIZER(Purple Top)SARS-COV-2 Vaccination 08/01/2019, 08/22/2019   Tdap 04/29/2015   Zoster Recombinant(Shingrix) 06/16/2021, 08/18/2021     Objective: Vital Signs: LMP 01/21/2011    Physical Exam   Musculoskeletal Exam: ***  CDAI Exam: CDAI Score: -- Patient Global: --; Provider Global: -- Swollen: --; Tender: -- Joint Exam 02/28/2023   No joint exam has been documented for this visit   There is currently no information documented on the homunculus. Go to the Rheumatology activity and complete the homunculus joint exam.  Investigation: No additional findings.  Imaging: No results found.  Recent Labs: Lab Results  Component Value Date   WBC 4.0 06/21/2022   HGB 13.3 06/21/2022   PLT 225 06/21/2022   NA 143 12/25/2022   K 4.4 12/25/2022   CL 103 12/25/2022   CO2 24 12/25/2022   GLUCOSE 81 12/25/2022   BUN 20 12/25/2022   CREATININE 1.13 (H) 12/25/2022   BILITOT 0.5 06/21/2022   ALKPHOS 100 06/21/2022   AST 33 06/21/2022   ALT 21 06/21/2022   PROT 6.8 06/21/2022   ALBUMIN 4.3 06/21/2022   CALCIUM 9.5 12/25/2022  GFRAA 61 12/02/2019    Speciality Comments: No specialty comments available.  Procedures:  No procedures performed Allergies: Nifedipine and Hydrocodone   Assessment / Plan:     Visit Diagnoses: No diagnosis found.  Orders: No orders of the defined types were placed in this encounter.  No orders of the defined types were placed in this encounter.   Face-to-face time spent with patient was *** minutes. Greater than 50% of time was spent  in counseling and coordination of care.  Follow-Up Instructions: No follow-ups on file.   Pollyann Savoy, MD  Note - This record has been created using Animal nutritionist.  Chart creation errors have been sought, but may not always  have been located. Such creation errors do not reflect on  the standard of medical care.

## 2023-02-28 ENCOUNTER — Ambulatory Visit: Payer: Commercial Managed Care - PPO | Admitting: Rheumatology

## 2023-02-28 DIAGNOSIS — E559 Vitamin D deficiency, unspecified: Secondary | ICD-10-CM

## 2023-02-28 DIAGNOSIS — M2550 Pain in unspecified joint: Secondary | ICD-10-CM

## 2023-02-28 DIAGNOSIS — I10 Essential (primary) hypertension: Secondary | ICD-10-CM

## 2023-02-28 DIAGNOSIS — Z823 Family history of stroke: Secondary | ICD-10-CM

## 2023-02-28 DIAGNOSIS — M249 Joint derangement, unspecified: Secondary | ICD-10-CM

## 2023-02-28 DIAGNOSIS — G35 Multiple sclerosis: Secondary | ICD-10-CM

## 2023-02-28 DIAGNOSIS — M19041 Primary osteoarthritis, right hand: Secondary | ICD-10-CM

## 2023-02-28 DIAGNOSIS — M791 Myalgia, unspecified site: Secondary | ICD-10-CM

## 2023-02-28 DIAGNOSIS — M5030 Other cervical disc degeneration, unspecified cervical region: Secondary | ICD-10-CM

## 2023-02-28 DIAGNOSIS — G8929 Other chronic pain: Secondary | ICD-10-CM

## 2023-04-04 ENCOUNTER — Encounter: Payer: Self-pay | Admitting: Family Medicine

## 2023-04-04 ENCOUNTER — Ambulatory Visit: Payer: Commercial Managed Care - PPO | Admitting: Family Medicine

## 2023-04-04 ENCOUNTER — Other Ambulatory Visit (HOSPITAL_COMMUNITY): Payer: Self-pay

## 2023-04-04 VITALS — BP 120/80 | HR 63 | Temp 98.8°F | Ht 61.0 in | Wt 154.0 lb

## 2023-04-04 DIAGNOSIS — J111 Influenza due to unidentified influenza virus with other respiratory manifestations: Secondary | ICD-10-CM | POA: Diagnosis not present

## 2023-04-04 DIAGNOSIS — R051 Acute cough: Secondary | ICD-10-CM

## 2023-04-04 DIAGNOSIS — R0981 Nasal congestion: Secondary | ICD-10-CM | POA: Diagnosis not present

## 2023-04-04 LAB — POC SOFIA 2 FLU + SARS ANTIGEN FIA
Influenza A, POC: NEGATIVE
Influenza B, POC: NEGATIVE
SARS Coronavirus 2 Ag: NEGATIVE

## 2023-04-04 MED ORDER — FLUTICASONE PROPIONATE 50 MCG/ACT NA SUSP
1.0000 | Freq: Every day | NASAL | 2 refills | Status: AC
Start: 2023-04-04 — End: ?
  Filled 2023-04-04: qty 16, 60d supply, fill #0
  Filled 2023-06-01: qty 16, 60d supply, fill #1
  Filled 2023-08-22: qty 16, 60d supply, fill #2

## 2023-04-04 MED ORDER — PROMETHAZINE-DM 6.25-15 MG/5ML PO SYRP
5.0000 mL | ORAL_SOLUTION | Freq: Two times a day (BID) | ORAL | 0 refills | Status: DC | PRN
Start: 2023-04-04 — End: 2023-06-25
  Filled 2023-04-04: qty 118, 12d supply, fill #0

## 2023-04-04 MED ORDER — BENZONATATE 100 MG PO CAPS
100.0000 mg | ORAL_CAPSULE | Freq: Three times a day (TID) | ORAL | 0 refills | Status: DC | PRN
Start: 2023-04-04 — End: 2023-06-25
  Filled 2023-04-04: qty 30, 10d supply, fill #0

## 2023-04-04 MED ORDER — OSELTAMIVIR PHOSPHATE 75 MG PO CAPS
75.0000 mg | ORAL_CAPSULE | Freq: Two times a day (BID) | ORAL | 0 refills | Status: AC
Start: 2023-04-04 — End: 2023-04-09
  Filled 2023-04-04: qty 10, 5d supply, fill #0

## 2023-04-04 NOTE — Progress Notes (Signed)
 I,Jameka J Llittleton, CMA,acting as a Neurosurgeon for [employer], NP.,have documented all relevant documentation on the behalf of Ellender Hose, NP,as directed by  Ellender Hose, NP while in the presence of Ellender Hose, NP.  Subjective:  Patient ID: Rebecca Zamora , female    DOB: May 11, 1965 , 58 y.o.   MRN: [id number]  Chief Complaint  Patient presents with   URI    HPI  Patient is a 58 year old female who presents today for cold symptoms that started 1 week after been exposed to someone with influenza A. She states that she was watching her grandson about a week ago and he got sick and was diagnosed with the flu but a week later on Monday, about 48 hours ago, she started having nausea, diarrhea, chills, body aches, fever 101.4 F, she states that she has been eating lots of garlic, cloves and some ginger as her home remedy for both prevention and treatment of influenza. She is here today for evaluation and treatment.  URI  The current episode started in the past 7 days. The problem has been unchanged. The maximum temperature recorded prior to her arrival was 101 - 101.9 F. Associated symptoms include congestion, coughing and headaches. Associated symptoms comments: Fatigue . She has tried decongestant (garlic and ginger) for the symptoms. The treatment provided no relief.     Past Medical History:  Diagnosis Date   Acute pelvic pain 01/23/2011   Atypical chest pain 06/24/2018   Cardiomegaly    HTN (hypertension)    No pertinent past medical history      Family History  Problem Relation Age of Onset   Multiple sclerosis Mother    Hypertension Father    Stroke Father    Gout Father    Multiple sclerosis Sister    Stroke Sister    Gout Brother    Hypertension Brother    Healthy Daughter    Healthy Son    Other Neg Hx    Diabetes Neg Hx      Current Outpatient Medications:    amLODipine (NORVASC) 5 MG tablet, Take 1 tablet (5 mg total) by mouth daily., Disp: 90 tablet, Rfl: 1    benzonatate (TESSALON PERLES) 100 MG capsule, Take 1 capsule (100 mg total) by mouth 3 (three) times daily as needed., Disp: 30 capsule, Rfl: 0   Blood Pressure Monitoring (OMRON 3 SERIES BP MONITOR) DEVI, Use as directed, Disp: 1 each, Rfl: 0   cyclobenzaprine (FLEXERIL) 10 MG tablet, Take 1 tablet (10 mg total) by mouth 3 (three) times daily as needed for muscle spasms., Disp: 30 tablet, Rfl: 2   EPINEPHrine 0.3 mg/0.3 mL IJ SOAJ injection, Inject 0.3 mg into the muscle as needed for anaphylaxis., Disp: 2 each, Rfl: 1   fluticasone (FLONASE) 50 MCG/ACT nasal spray, Place 1 spray into both nostrils daily., Disp: 16 g, Rfl: 2   linaclotide (LINZESS) 72 MCG capsule, Take 1 capsule (72 mcg total) by mouth daily before breakfast., Disp: 30 capsule, Rfl: 5   promethazine-dextromethorphan (PROMETHAZINE-DM) 6.25-15 MG/5ML syrup, Take 5 mLs by mouth 2 (two) times daily as needed for cough., Disp: 118 mL, Rfl: 0   triamcinolone ointment (KENALOG) 0.1 %, Apply 1 application topically 2 (two) times daily as needed. Avoid the face., Disp: 454 g, Rfl: 1   VITAMIN D PO, Take 2,000 Units by mouth daily., Disp: , Rfl:    Allergies  Allergen Reactions   Nifedipine Other (See Comments)    Depression, nausea,  fatigue   Hydrocodone Nausea Only     Review of Systems  Constitutional:  Positive for chills and fever.  HENT:  Positive for congestion and sinus pressure.   Eyes: Negative.   Respiratory:  Positive for cough. Negative for shortness of breath.   Cardiovascular: Negative.   Gastrointestinal: Negative.   Musculoskeletal: Negative.   Skin: Negative.   Neurological:  Positive for headaches.  Psychiatric/Behavioral: Negative.       Today's Vitals   04/04/23 1637 04/04/23 1704  BP: 120/80   Pulse: 63   Temp: (!) 97.3 F (36.3 C) 98.8 F (37.1 C)  TempSrc: Oral Oral  Weight: 154 lb (69.9 kg)   Height: 5\' 1"  (1.549 m)   PainSc: 0-No pain    Body mass index is 29.1 kg/m.  Wt Readings from Last  3 Encounters:  04/04/23 154 lb (69.9 kg)  12/25/22 154 lb 3.2 oz (69.9 kg)  08/31/22 151 lb 6.4 oz (68.7 kg)    The 10-year ASCVD risk score (Arnett DK, et al., 2019) is: 2.9%   Values used to calculate the score:     Age: 80 years     Sex: Female     Is Non-Hispanic African American: Yes     Diabetic: No     Tobacco smoker: No     Systolic Blood Pressure: 120 mmHg     Is BP treated: Yes     HDL Cholesterol: 78 mg/dL     Total Cholesterol: 187 mg/dL  Objective:  Physical Exam HENT:     Head: Normocephalic.  Cardiovascular:     Rate and Rhythm: Normal rate and regular rhythm.  Pulmonary:     Effort: Pulmonary effort is normal.     Breath sounds: Normal breath sounds.  Skin:    General: Skin is dry.  Neurological:     General: No focal deficit present.     Mental Status: She is oriented to person, place, and time.         Assessment And Plan:  Acute cough -     POC SOFIA 2 FLU + SARS ANTIGEN FIA -     Benzonatate; Take 1 capsule (100 mg total) by mouth 3 (three) times daily as needed.  Dispense: 30 capsule; Refill: 0 -     Promethazine-DM; Take 5 mLs by mouth 2 (two) times daily as needed for cough.  Dispense: 118 mL; Refill: 0  Nasal congestion -     POC SOFIA 2 FLU + SARS ANTIGEN FIA -     Fluticasone Propionate; Place 1 spray into both nostrils daily.  Dispense: 16 g; Refill: 2  Influenza -     Oseltamivir Phosphate; Take 1 capsule (75 mg total) by mouth 2 (two) times daily for 5 days.  Dispense: 10 capsule; Refill: 0    Return if symptoms worsen or fail to improve.  Patient was given opportunity to ask questions. Patient verbalized understanding of the plan and was able to repeat key elements of the plan. All questions were answered to their satisfaction.   I, Ellender Hose, NP, have reviewed all documentation for this visit. The documentation on 04/10/2023 for the exam, diagnosis, procedures, and orders are all accurate and complete.    IF YOU HAVE BEEN REFERRED  TO A SPECIALIST, IT MAY TAKE 1-2 WEEKS TO SCHEDULE/PROCESS THE REFERRAL. IF YOU HAVE NOT HEARD FROM US/SPECIALIST IN TWO WEEKS, PLEASE GIVE [place] A CALL AT [phone].

## 2023-04-10 DIAGNOSIS — J111 Influenza due to unidentified influenza virus with other respiratory manifestations: Secondary | ICD-10-CM | POA: Insufficient documentation

## 2023-04-10 DIAGNOSIS — R0981 Nasal congestion: Secondary | ICD-10-CM | POA: Insufficient documentation

## 2023-04-10 DIAGNOSIS — R051 Acute cough: Secondary | ICD-10-CM | POA: Insufficient documentation

## 2023-04-10 HISTORY — DX: Influenza due to unidentified influenza virus with other respiratory manifestations: J11.1

## 2023-05-28 ENCOUNTER — Other Ambulatory Visit: Payer: Self-pay | Admitting: Nurse Practitioner

## 2023-05-28 ENCOUNTER — Other Ambulatory Visit (HOSPITAL_COMMUNITY): Payer: Self-pay

## 2023-05-28 DIAGNOSIS — I10 Essential (primary) hypertension: Secondary | ICD-10-CM

## 2023-05-29 ENCOUNTER — Other Ambulatory Visit: Payer: Self-pay | Admitting: Nurse Practitioner

## 2023-05-29 ENCOUNTER — Other Ambulatory Visit: Payer: Self-pay

## 2023-05-29 ENCOUNTER — Other Ambulatory Visit (HOSPITAL_COMMUNITY): Payer: Self-pay

## 2023-05-29 DIAGNOSIS — I10 Essential (primary) hypertension: Secondary | ICD-10-CM

## 2023-05-29 MED ORDER — AMLODIPINE BESYLATE 5 MG PO TABS
5.0000 mg | ORAL_TABLET | Freq: Every day | ORAL | 1 refills | Status: DC
Start: 2023-05-29 — End: 2023-11-02
  Filled 2023-05-29: qty 90, 90d supply, fill #0
  Filled 2023-08-22: qty 90, 90d supply, fill #1

## 2023-05-29 NOTE — Telephone Encounter (Signed)
 Copied from CRM [id number]. Topic: Clinical - Medication Refill >> May 29, 2023 11:53 AM Vista Lawman wrote: Most Recent Primary Care Visit:  Provider: Moshe Salisbury, PAT  Department: [facility]  Visit Type: OFFICE VISIT  Date: 04/04/2023  Medication: amLODipine (NORVASC) 5 MG tablet  Has the patient contacted their pharmacy? Yes (Agent: If no, request that the patient contact the pharmacy for the refill. If patient does not wish to contact the pharmacy document the reason why and proceed with request.) (Agent: If yes, when and what did the pharmacy advise?)  Is this the correct pharmacy for this prescription? Yes If no, delete pharmacy and type the correct one.  This is the patient's preferred pharmacy:  [place] [facility] [address] [place] [address] Phone: [phone] Fax: [phone]   Has the prescription been filled recently? Yes  Is the patient out of the medication? Yes  Has the patient been seen for an appointment in the last year OR does the patient have an upcoming appointment? Yes  Can we respond through MyChart? No  Agent: Please be advised that Rx refills may take up to 3 business days. We ask that you follow-up with your pharmacy.

## 2023-06-25 ENCOUNTER — Ambulatory Visit: Payer: Commercial Managed Care - PPO | Admitting: Nurse Practitioner

## 2023-06-25 ENCOUNTER — Other Ambulatory Visit (HOSPITAL_COMMUNITY): Payer: Self-pay

## 2023-06-25 ENCOUNTER — Encounter: Payer: Self-pay | Admitting: Nurse Practitioner

## 2023-06-25 VITALS — BP 120/60 | HR 74 | Temp 97.8°F | Ht 61.0 in | Wt 153.0 lb

## 2023-06-25 DIAGNOSIS — Z0000 Encounter for general adult medical examination without abnormal findings: Secondary | ICD-10-CM | POA: Insufficient documentation

## 2023-06-25 DIAGNOSIS — Z79899 Other long term (current) drug therapy: Secondary | ICD-10-CM

## 2023-06-25 DIAGNOSIS — Z6828 Body mass index (BMI) 28.0-28.9, adult: Secondary | ICD-10-CM

## 2023-06-25 DIAGNOSIS — N183 Chronic kidney disease, stage 3 unspecified: Secondary | ICD-10-CM

## 2023-06-25 DIAGNOSIS — I129 Hypertensive chronic kidney disease with stage 1 through stage 4 chronic kidney disease, or unspecified chronic kidney disease: Secondary | ICD-10-CM | POA: Diagnosis not present

## 2023-06-25 DIAGNOSIS — Z13228 Encounter for screening for other metabolic disorders: Secondary | ICD-10-CM

## 2023-06-25 DIAGNOSIS — E663 Overweight: Secondary | ICD-10-CM | POA: Diagnosis not present

## 2023-06-25 DIAGNOSIS — K5909 Other constipation: Secondary | ICD-10-CM

## 2023-06-25 DIAGNOSIS — R82998 Other abnormal findings in urine: Secondary | ICD-10-CM

## 2023-06-25 LAB — POCT URINALYSIS DIP (CLINITEK)
Bilirubin, UA: NEGATIVE
Glucose, UA: NEGATIVE mg/dL
Ketones, POC UA: NEGATIVE mg/dL
Nitrite, UA: NEGATIVE
POC PROTEIN,UA: NEGATIVE
Spec Grav, UA: 1.01 (ref 1.010–1.025)
Urobilinogen, UA: 0.2 U/dL
pH, UA: 5.5 (ref 5.0–8.0)

## 2023-06-25 MED ORDER — LINACLOTIDE 72 MCG PO CAPS
72.0000 ug | ORAL_CAPSULE | Freq: Every day | ORAL | 1 refills | Status: DC
  Filled 2023-06-25: qty 90, 90d supply, fill #0
  Filled 2023-11-02: qty 90, 90d supply, fill #1

## 2023-06-25 NOTE — Assessment & Plan Note (Addendum)
 Blood pressure is well controlled, continue current medications. Will check eGFR. EKG done with NSR HR 61

## 2023-06-25 NOTE — Assessment & Plan Note (Signed)
 Behavior modifications discussed and diet history reviewed.   Pt will continue to exercise regularly and modify diet with low GI, plant based foods and decrease intake of processed foods.  Recommend intake of daily multivitamin, Vitamin D, and calcium.  Recommend mammogram (scheduled for July with GYN) and colonoscopy for preventive screenings, as well as recommend immunizations that include influenza, TDAP, and Shingles

## 2023-06-25 NOTE — Assessment & Plan Note (Signed)
 She was started on Linzess  in February but was too expensive for a 30 day supply, was effective. Sent a Rx for 90 days and one sample given

## 2023-06-25 NOTE — Patient Instructions (Signed)
 Health Maintenance  Topic Date Due   COVID-19 Vaccine (3 - Pfizer risk series) 09/19/2019   Mammogram  02/17/2023   Flu Shot  09/21/2023   Pap with HPV screening  06/16/2024   DTaP/Tdap/Td vaccine (2 - Td or Tdap) 04/28/2025   Colon Cancer Screening  06/23/2027   Hepatitis C Screening  Completed   HIV Screening  Completed   Zoster (Shingles) Vaccine  Completed   HPV Vaccine  Aged Out   Meningitis B Vaccine  Aged Out   In the future you can come in early for your fasting labs to avoid you not eating all day.

## 2023-06-25 NOTE — Progress Notes (Signed)
 [place], CMA,acting as a Neurosurgeon for Rebecca Epley, FNP.,have documented all relevant documentation on the behalf of Rebecca Epley, FNP,as directed by  Rebecca Epley, FNP while in the presence of Rebecca Epley, FNP.  Subjective:    Patient ID: Rebecca Zamora , female    DOB: 12/10/1965 , 58 y.o.   MRN: [id number]  Chief Complaint  Patient presents with   Annual Exam    Patient presents today for HM, Patient reports compliance with medication. Patient denies any chest pain, SOB, or headaches. Patient has no concerns today. Patient reports she is scheduled for July 3rd for her mammogram.    HPI  She is here today for HM. She has not eaten all day and has a banana in her bag.      Past Medical History:  Diagnosis Date   Acute pelvic pain 01/23/2011   Atypical chest pain 06/24/2018   Cardiomegaly    HTN (hypertension)    Influenza 04/10/2023   No pertinent past medical history    Weakness 08/03/2022     Family History  Problem Relation Age of Onset   Multiple sclerosis Mother    Hypertension Father    Stroke Father    Gout Father    Multiple sclerosis Sister    Stroke Sister    Gout Brother    Hypertension Brother    Healthy Daughter    Healthy Son    Other Neg Hx    Diabetes Neg Hx      Current Outpatient Medications:    amLODipine  (NORVASC ) 5 MG tablet, Take 1 tablet (5 mg total) by mouth daily., Disp: 90 tablet, Rfl: 1   Blood Pressure Monitoring (OMRON 3 SERIES BP MONITOR) DEVI, Use as directed, Disp: 1 each, Rfl: 0   cyclobenzaprine  (FLEXERIL ) 10 MG tablet, Take 1 tablet (10 mg total) by mouth 3 (three) times daily as needed for muscle spasms., Disp: 30 tablet, Rfl: 2   EPINEPHrine  0.3 mg/0.3 mL IJ SOAJ injection, Inject 0.3 mg into the muscle as needed for anaphylaxis., Disp: 2 each, Rfl: 1   fluticasone  (FLONASE ) 50 MCG/ACT nasal spray, Place 1 spray into both nostrils daily., Disp: 16 g, Rfl: 2   triamcinolone  ointment (KENALOG ) 0.1 %, Apply 1 application  topically 2 (two) times daily as needed. Avoid the face., Disp: 454 g, Rfl: 1   VITAMIN D PO, Take 2,000 Units by mouth daily., Disp: , Rfl:    linaclotide  (LINZESS ) 72 MCG capsule, Take 1 capsule (72 mcg total) by mouth daily before breakfast., Disp: 90 capsule, Rfl: 1   Allergies  Allergen Reactions   Nifedipine  Other (See Comments)    Depression, nausea, fatigue   Hydrocodone Nausea Only      The patient states she uses status post hysterectomy for birth control. Patient's last menstrual period was 01/21/2011.  Negative for Dysmenorrhea and Negative for Menorrhagia. Negative for: breast discharge, breast lump(s), breast pain and breast self exam. Associated symptoms include abnormal vaginal bleeding. Pertinent negatives include abnormal bleeding (hematology), anxiety, decreased libido, depression, difficulty falling sleep, dyspareunia, history of infertility, nocturia, sexual dysfunction, sleep disturbances, urinary incontinence, urinary urgency, vaginal discharge and vaginal itching. Diet regular. She has been trying to eat healthy. The patient states her exercise level is walking 5-6 days a week for one hour.    The patient's tobacco use is:  Social History   Tobacco Use  Smoking Status Never   Passive exposure: Never  Smokeless Tobacco Never   She has been  exposed to passive smoke. The patient's alcohol use is:  Social History   Substance and Sexual Activity  Alcohol Use Not Currently    Review of Systems  Constitutional: Negative.   HENT: Negative.    Eyes: Negative.   Respiratory: Negative.    Cardiovascular: Negative.   Gastrointestinal: Negative.   Endocrine: Negative.   Genitourinary: Negative.   Musculoskeletal: Negative.   Skin: Negative.   Allergic/Immunologic: Negative.   Neurological: Negative.   Hematological: Negative.   Psychiatric/Behavioral: Negative.       Today's Vitals   06/25/23 1527  BP: 120/60  Pulse: 74  Temp: 97.8 F (36.6 C)  TempSrc:  Oral  Weight: 153 lb (69.4 kg)  Height: 5\' 1"  (1.549 m)  PainSc: 0-No pain   Body mass index is 28.91 kg/m.  Wt Readings from Last 3 Encounters:  06/25/23 153 lb (69.4 kg)  04/04/23 154 lb (69.9 kg)  12/25/22 154 lb 3.2 oz (69.9 kg)     Objective:  Physical Exam Vitals and nursing note reviewed. Exam conducted with a chaperone present.  Constitutional:      General: She is not in acute distress.    Appearance: Normal appearance. She is well-developed.  HENT:     Head: Normocephalic and atraumatic.     Right Ear: Hearing, tympanic membrane, ear canal and external ear normal. There is no impacted cerumen.     Left Ear: Hearing, tympanic membrane, ear canal and external ear normal. There is no impacted cerumen.     Nose: Nose normal.     Mouth/Throat:     Mouth: Mucous membranes are moist.  Eyes:     General: Lids are normal.     Extraocular Movements: Extraocular movements intact.     Conjunctiva/sclera: Conjunctivae normal.     Pupils: Pupils are equal, round, and reactive to light.     Funduscopic exam:    Right eye: No papilledema.        Left eye: No papilledema.  Neck:     Thyroid : No thyroid  mass.     Vascular: No carotid bruit.  Cardiovascular:     Rate and Rhythm: Normal rate and regular rhythm.     Pulses: Normal pulses.     Heart sounds: Normal heart sounds. No murmur heard. Pulmonary:     Effort: Pulmonary effort is normal. No respiratory distress.     Breath sounds: Normal breath sounds. No wheezing.  Abdominal:     General: Abdomen is flat. Bowel sounds are normal. There is no distension.     Palpations: Abdomen is soft.     Hernia: There is no hernia in the left inguinal area or right inguinal area.  Genitourinary:    Pubic Area: No rash.      Labia:        Right: No rash or tenderness.        Left: No rash or tenderness.      Vagina: Normal.     Cervix: Normal.     Uterus: Absent.   Musculoskeletal:        General: No swelling. Normal range of  motion.     Cervical back: Full passive range of motion without pain, normal range of motion and neck supple. No rigidity.     Right lower leg: No edema.     Left lower leg: No edema.  Skin:    General: Skin is warm and dry.     Capillary Refill: Capillary refill takes less than 2 seconds.  Comments: She has a belly piercing to navel.   Neurological:     General: No focal deficit present.     Mental Status: She is alert and oriented to person, place, and time.     Cranial Nerves: No cranial nerve deficit.     Sensory: No sensory deficit.     Motor: No weakness.  Psychiatric:        Mood and Affect: Mood normal.        Behavior: Behavior normal.        Thought Content: Thought content normal.        Judgment: Judgment normal.     Assessment And Plan:     Encounter for annual health examination Assessment & Plan: Behavior modifications discussed and diet history reviewed.   Pt will continue to exercise regularly and modify diet with low GI, plant based foods and decrease intake of processed foods.  Recommend intake of daily multivitamin, Vitamin D, and calcium.  Recommend mammogram (scheduled for July with GYN) and colonoscopy for preventive screenings, as well as recommend immunizations that include influenza, TDAP, and Shingles    Encounter for screening for metabolic disorder -     Hemoglobin A1c  Overweight with body mass index (BMI) of 28 to 28.9 in adult  Other long term (current) drug therapy -     CBC with Differential/Platelet  Benign hypertension with CKD (chronic kidney disease) stage III (HCC) Assessment & Plan: Blood pressure is well controlled, continue current medications. Will check eGFR. EKG done with NSR HR 61  Orders: -     EKG 12-Lead -     POCT URINALYSIS DIP (CLINITEK) -     Microalbumin / creatinine urine ratio -     CMP14+EGFR  Leukocytes in urine -     Urine Culture  Other constipation Assessment & Plan: She was started on Linzess  in  February but was too expensive for a 30 day supply, was effective. Sent a Rx for 90 days and one sample given  Orders: -     linaCLOtide ; Take 1 capsule (72 mcg total) by mouth daily before breakfast.  Dispense: 90 capsule; Refill: 1   Return for 1 year physical, 6 month bp check. Patient was given opportunity to ask questions. Patient verbalized understanding of the plan and was able to repeat key elements of the plan. All questions were answered to their satisfaction.   Rebecca Epley, FNP  I, Rebecca Epley, FNP, have reviewed all documentation for this visit. The documentation on 06/25/23 for the exam, diagnosis, procedures, and orders are all accurate and complete.

## 2023-06-26 ENCOUNTER — Other Ambulatory Visit (HOSPITAL_COMMUNITY): Payer: Self-pay

## 2023-06-27 LAB — CBC WITH DIFFERENTIAL/PLATELET
Basophils Absolute: 0 10*3/uL (ref 0.0–0.2)
Basos: 1 %
EOS (ABSOLUTE): 0.1 10*3/uL (ref 0.0–0.4)
Eos: 2 %
Hematocrit: 44.9 % (ref 34.0–46.6)
Hemoglobin: 14.8 g/dL (ref 11.1–15.9)
Immature Grans (Abs): 0 10*3/uL (ref 0.0–0.1)
Immature Granulocytes: 0 %
Lymphocytes Absolute: 2.3 10*3/uL (ref 0.7–3.1)
Lymphs: 50 %
MCH: 29.2 pg (ref 26.6–33.0)
MCHC: 33 g/dL (ref 31.5–35.7)
MCV: 89 fL (ref 79–97)
Monocytes Absolute: 0.3 10*3/uL (ref 0.1–0.9)
Monocytes: 7 %
Neutrophils Absolute: 1.8 10*3/uL (ref 1.4–7.0)
Neutrophils: 40 %
Platelets: 257 10*3/uL (ref 150–450)
RBC: 5.07 x10E6/uL (ref 3.77–5.28)
RDW: 13.6 % (ref 11.7–15.4)
WBC: 4.5 10*3/uL (ref 3.4–10.8)

## 2023-06-27 LAB — CMP14+EGFR
ALT: 20 IU/L (ref 0–32)
AST: 28 IU/L (ref 0–40)
Albumin: 4.6 g/dL (ref 3.8–4.9)
Alkaline Phosphatase: 106 IU/L (ref 44–121)
BUN/Creatinine Ratio: 15 (ref 9–23)
BUN: 16 mg/dL (ref 6–24)
Bilirubin Total: 0.4 mg/dL (ref 0.0–1.2)
CO2: 24 mmol/L (ref 20–29)
Calcium: 9.3 mg/dL (ref 8.7–10.2)
Chloride: 103 mmol/L (ref 96–106)
Creatinine, Ser: 1.07 mg/dL — ABNORMAL HIGH (ref 0.57–1.00)
Globulin, Total: 2.6 g/dL (ref 1.5–4.5)
Glucose: 108 mg/dL — ABNORMAL HIGH (ref 70–99)
Potassium: 3.9 mmol/L (ref 3.5–5.2)
Sodium: 142 mmol/L (ref 134–144)
Total Protein: 7.2 g/dL (ref 6.0–8.5)
eGFR: 61 mL/min/{1.73_m2} (ref >59)

## 2023-06-27 LAB — MICROALBUMIN / CREATININE URINE RATIO
Creatinine, Urine: 157.9 mg/dL
Microalb/Creat Ratio: 7 mg/g{creat} (ref 0–29)
Microalbumin, Urine: 10.9 ug/mL

## 2023-06-27 LAB — HEMOGLOBIN A1C
Est. average glucose Bld gHb Est-mCnc: 111 mg/dL
Hgb A1c MFr Bld: 5.5 % (ref 4.8–5.6)

## 2023-06-27 LAB — URINE CULTURE

## 2023-07-01 ENCOUNTER — Encounter: Payer: Self-pay | Admitting: Nurse Practitioner

## 2023-07-01 DIAGNOSIS — R82998 Other abnormal findings in urine: Secondary | ICD-10-CM | POA: Insufficient documentation

## 2023-07-13 ENCOUNTER — Other Ambulatory Visit (HOSPITAL_COMMUNITY): Payer: Self-pay

## 2023-08-21 DIAGNOSIS — N1831 Chronic kidney disease, stage 3a: Secondary | ICD-10-CM | POA: Diagnosis not present

## 2023-08-21 DIAGNOSIS — I129 Hypertensive chronic kidney disease with stage 1 through stage 4 chronic kidney disease, or unspecified chronic kidney disease: Secondary | ICD-10-CM | POA: Diagnosis not present

## 2023-08-21 DIAGNOSIS — E876 Hypokalemia: Secondary | ICD-10-CM | POA: Diagnosis not present

## 2023-09-05 ENCOUNTER — Other Ambulatory Visit (HOSPITAL_COMMUNITY): Payer: Self-pay

## 2023-09-05 DIAGNOSIS — Z1231 Encounter for screening mammogram for malignant neoplasm of breast: Secondary | ICD-10-CM | POA: Diagnosis not present

## 2023-09-05 DIAGNOSIS — I10 Essential (primary) hypertension: Secondary | ICD-10-CM | POA: Diagnosis not present

## 2023-09-05 DIAGNOSIS — Z01419 Encounter for gynecological examination (general) (routine) without abnormal findings: Secondary | ICD-10-CM | POA: Diagnosis not present

## 2023-09-05 DIAGNOSIS — Z1389 Encounter for screening for other disorder: Secondary | ICD-10-CM | POA: Diagnosis not present

## 2023-09-05 DIAGNOSIS — Z90710 Acquired absence of both cervix and uterus: Secondary | ICD-10-CM | POA: Diagnosis not present

## 2023-09-05 DIAGNOSIS — N951 Menopausal and female climacteric states: Secondary | ICD-10-CM | POA: Diagnosis not present

## 2023-09-05 DIAGNOSIS — E7849 Other hyperlipidemia: Secondary | ICD-10-CM | POA: Diagnosis not present

## 2023-09-05 LAB — HM MAMMOGRAPHY

## 2023-09-05 MED ORDER — ESTRADIOL 0.05 MG/24HR TD PTTW
1.0000 | MEDICATED_PATCH | TRANSDERMAL | 5 refills | Status: DC
  Filled 2023-09-05: qty 24, 84d supply, fill #0
  Filled 2023-12-06: qty 24, 84d supply, fill #1

## 2023-09-14 ENCOUNTER — Other Ambulatory Visit (HOSPITAL_COMMUNITY): Payer: Self-pay

## 2023-09-18 ENCOUNTER — Other Ambulatory Visit: Payer: Self-pay | Admitting: Nurse Practitioner

## 2023-09-18 NOTE — Telephone Encounter (Unsigned)
 Copied from CRM [id number]. Topic: Clinical - Medication Refill >> Sep 18, 2023 11:30 AM Tiffany B wrote: Medication: Blood Pressure Monitoring (OMRON 3 SERIES BP MONITOR) DEVI Patient states her BP monitor is not working.    Has the patient contacted their pharmacy? Yes   This is the patient's preferred pharmacy:  [place] - [facility] [address] [place] [address] Phone: [phone] Fax: [phone]  Is this the correct pharmacy for this prescription? Yes If no, delete pharmacy and type the correct one.   Has the prescription been filled recently? No  Is the patient out of the medication? Yes  Has the patient been seen for an appointment in the last year OR does the patient have an upcoming appointment? Yes  Can we respond through MyChart? No  Agent: Please be advised that Rx refills may take up to 3 business days. We ask that you follow-up with your pharmacy.

## 2023-09-19 ENCOUNTER — Other Ambulatory Visit (HOSPITAL_COMMUNITY): Payer: Self-pay

## 2023-09-19 MED ORDER — OMRON 3 SERIES BP MONITOR DEVI
0 refills | Status: DC
  Filled 2023-09-19: qty 1, 30d supply, fill #0

## 2023-11-02 ENCOUNTER — Other Ambulatory Visit (HOSPITAL_COMMUNITY): Payer: Self-pay

## 2023-11-02 ENCOUNTER — Other Ambulatory Visit: Payer: Self-pay | Admitting: Nurse Practitioner

## 2023-11-02 ENCOUNTER — Other Ambulatory Visit: Payer: Self-pay

## 2023-11-02 DIAGNOSIS — I10 Essential (primary) hypertension: Secondary | ICD-10-CM

## 2023-11-05 ENCOUNTER — Other Ambulatory Visit (HOSPITAL_COMMUNITY): Payer: Self-pay

## 2023-11-05 MED ORDER — AMLODIPINE BESYLATE 5 MG PO TABS
5.0000 mg | ORAL_TABLET | Freq: Every day | ORAL | 1 refills | Status: AC
  Filled 2023-11-05: qty 90, 90d supply, fill #0
  Filled 2024-02-15: qty 90, 90d supply, fill #1

## 2023-12-06 ENCOUNTER — Other Ambulatory Visit: Payer: Self-pay | Admitting: Family Medicine

## 2023-12-06 ENCOUNTER — Other Ambulatory Visit (HOSPITAL_COMMUNITY): Payer: Self-pay

## 2023-12-06 DIAGNOSIS — R0981 Nasal congestion: Secondary | ICD-10-CM

## 2023-12-07 ENCOUNTER — Other Ambulatory Visit (HOSPITAL_COMMUNITY): Payer: Self-pay

## 2023-12-07 ENCOUNTER — Other Ambulatory Visit: Payer: Self-pay

## 2023-12-07 MED ORDER — FLUTICASONE PROPIONATE 50 MCG/ACT NA SUSP
1.0000 | Freq: Every day | NASAL | 2 refills | Status: AC
  Filled 2023-12-07: qty 16, 60d supply, fill #0

## 2023-12-27 ENCOUNTER — Ambulatory Visit: Payer: Self-pay | Admitting: Nurse Practitioner

## 2023-12-27 ENCOUNTER — Other Ambulatory Visit (HOSPITAL_COMMUNITY): Payer: Self-pay

## 2023-12-27 ENCOUNTER — Encounter: Payer: Self-pay | Admitting: Nurse Practitioner

## 2023-12-27 VITALS — BP 126/80 | HR 85 | Temp 97.7°F | Ht 61.0 in | Wt 157.8 lb

## 2023-12-27 DIAGNOSIS — I129 Hypertensive chronic kidney disease with stage 1 through stage 4 chronic kidney disease, or unspecified chronic kidney disease: Secondary | ICD-10-CM | POA: Diagnosis not present

## 2023-12-27 DIAGNOSIS — N182 Chronic kidney disease, stage 2 (mild): Secondary | ICD-10-CM | POA: Diagnosis not present

## 2023-12-27 DIAGNOSIS — Z1231 Encounter for screening mammogram for malignant neoplasm of breast: Secondary | ICD-10-CM

## 2023-12-27 DIAGNOSIS — R051 Acute cough: Secondary | ICD-10-CM

## 2023-12-27 DIAGNOSIS — Z139 Encounter for screening, unspecified: Secondary | ICD-10-CM

## 2023-12-27 DIAGNOSIS — E663 Overweight: Secondary | ICD-10-CM

## 2023-12-27 DIAGNOSIS — Z6829 Body mass index (BMI) 29.0-29.9, adult: Secondary | ICD-10-CM | POA: Diagnosis not present

## 2023-12-27 MED ORDER — PREDNISONE 10 MG (21) PO TBPK
ORAL_TABLET | ORAL | 0 refills | Status: AC
  Filled 2023-12-27: qty 21, 6d supply, fill #0

## 2023-12-27 MED ORDER — PROMETHAZINE-DM 6.25-15 MG/5ML PO SYRP
5.0000 mL | ORAL_SOLUTION | Freq: Two times a day (BID) | ORAL | 0 refills | Status: AC | PRN
  Filled 2023-12-27: qty 118, 12d supply, fill #0

## 2023-12-27 NOTE — Assessment & Plan Note (Signed)
 Pt instructed on Self Breast Exam.According to ACOG guidelines Women aged 57 and older are recommended to get an annual mammogram. Order placed for mammogram

## 2023-12-27 NOTE — Assessment & Plan Note (Signed)
 Persistent cough post-vaccination. Discussed thimerosal reaction and alternative vaccine options. - Prescribed prednisone  taper for cough. - Provided exemption letter from influenza vaccination. - Consider alternative influenza vaccine without thimerosal if needed.

## 2023-12-27 NOTE — Progress Notes (Addendum)
 LILLETTE Kristeen JINNY Gladis, CMA,acting as a neurosurgeon for Rebecca Ada, FNP.,have documented all relevant documentation on the behalf of Rebecca Ada, FNP,as directed by  Rebecca Ada, FNP while in the presence of Rebecca Ada, FNP.  Subjective:  Patient ID: Rebecca Zamora , female    DOB: [birth year] , 58 y.o.   MRN: [id number]  Chief Complaint  Patient presents with   Hypertension    Patient presents today for a bp follow up, Patient reports compliance with medication. Patient denies any chest pain, SOB, or headaches. Patient has no concerns today.      HPI  Discussed the use of AI scribe software for clinical note transcription with the patient, who gave verbal consent to proceed.  History of Present Illness Shallon Vivi Palazzola is a 58 year old female with hypertension and chronic kidney disease stage 3A who presents for a follow-up on her blood pressure management.  Her blood pressure has been generally well-controlled with recent readings around 126/80 mmHg. Previously, her blood pressure was higher, but it has improved since her medication was adjusted from 10 mg to 5 mg of amlodipine  daily.  She has chronic kidney disease stage 3A and is concerned about the difficulty in scheduling appointments and potential delays in care if follow-ups are paused.  She experiences adverse reactions to the flu vaccine, including severe symptoms such as weakness, coughing, and sweating that can last for two to three months. She recalls a severe reaction 24 years ago that required hospitalization. She is not allergic to eggs but is concerned about other components of the vaccine. She currently takes multivitamins and vitamin C to help prevent illness during flu season.  She has a persistent cough that worsens at night, causing weakness and discomfort. She has previously found relief with a combination of Tamiflu , cough syrup, and nasal spray. Her symptoms can persist for up to three months after receiving the flu  vaccine.  Past Medical History:  Diagnosis Date   Acute pelvic pain 01/23/2011   Atypical chest pain 06/24/2018   Cardiomegaly    HTN (hypertension)    Influenza 04/10/2023   No pertinent past medical history    Weakness 08/03/2022     Family History  Problem Relation Age of Onset   Multiple sclerosis Mother    Hypertension Father    Stroke Father    Gout Father    Multiple sclerosis Sister    Stroke Sister    Gout Brother    Hypertension Brother    Healthy Daughter    Healthy Son    Other Neg Hx    Diabetes Neg Hx      Current Outpatient Medications:    amLODipine  (NORVASC ) 5 MG tablet, Take 1 tablet (5 mg total) by mouth daily., Disp: 90 tablet, Rfl: 1   cyclobenzaprine  (FLEXERIL ) 10 MG tablet, Take 1 tablet (10 mg total) by mouth 3 (three) times daily as needed for muscle spasms., Disp: 30 tablet, Rfl: 2   EPINEPHrine  0.3 mg/0.3 mL IJ SOAJ injection, Inject 0.3 mg into the muscle as needed for anaphylaxis., Disp: 2 each, Rfl: 1   fluticasone  (FLONASE ) 50 MCG/ACT nasal spray, Place 1 spray into both nostrils daily., Disp: 16 g, Rfl: 2   linaclotide  (LINZESS ) 72 MCG capsule, Take 1 capsule (72 mcg total) by mouth daily before breakfast., Disp: 90 capsule, Rfl: 1   triamcinolone  ointment (KENALOG ) 0.1 %, Apply 1 application topically 2 (two) times daily as needed. Avoid the face., Disp: 454 g, Rfl: 1  VITAMIN D PO, Take 2,000 Units by mouth daily., Disp: , Rfl:    predniSONE  (STERAPRED UNI-PAK 21 TAB) 10 MG (21) TBPK tablet, Take as directed, Disp: 21 tablet, Rfl: 0   promethazine -dextromethorphan (PROMETHAZINE -DM) 6.25-15 MG/5ML syrup, Take 5 mLs by mouth 2 (two) times daily as needed for cough., Disp: 118 mL, Rfl: 0   Allergies  Allergen Reactions   Nifedipine  Other (See Comments)    Depression, nausea, fatigue   Hydrocodone Nausea Only     Review of Systems  Constitutional: Negative.   HENT: Negative.    Eyes: Negative.   Respiratory: Negative.     Cardiovascular: Negative.   Gastrointestinal: Negative.   Musculoskeletal: Negative.   Neurological: Negative.   Psychiatric/Behavioral: Negative.       Today's Vitals   12/27/23 1546  BP: 126/80  Pulse: 85  Temp: 97.7 F (36.5 C)  TempSrc: Oral  Weight: 157 lb 12.8 oz (71.6 kg)  Height: 5' 1 (1.549 m)  PainSc: 0-No pain   Body mass index is 29.82 kg/m.  Wt Readings from Last 3 Encounters:  12/27/23 157 lb 12.8 oz (71.6 kg)  06/25/23 153 lb (69.4 kg)  04/04/23 154 lb (69.9 kg)     Objective:  Physical Exam Vitals and nursing note reviewed.  Constitutional:      General: She is not in acute distress.    Appearance: Normal appearance. She is obese.  HENT:     Right Ear: Tympanic membrane, ear canal and external ear normal. There is no impacted cerumen.     Left Ear: Tympanic membrane, ear canal and external ear normal. There is no impacted cerumen.     Nose: Nose normal. No congestion.     Right Sinus: No maxillary sinus tenderness or frontal sinus tenderness.     Left Sinus: No maxillary sinus tenderness or frontal sinus tenderness.  Cardiovascular:     Rate and Rhythm: Normal rate and regular rhythm.     Pulses: Normal pulses.     Heart sounds: Normal heart sounds. No murmur heard. Pulmonary:     Effort: Pulmonary effort is normal. No respiratory distress.     Breath sounds: Normal breath sounds. No wheezing.  Skin:    General: Skin is warm and dry.     Capillary Refill: Capillary refill takes less than 2 seconds.  Neurological:     General: No focal deficit present.     Mental Status: She is alert and oriented to person, place, and time.     Cranial Nerves: No cranial nerve deficit.     Motor: No weakness.  Psychiatric:        Mood and Affect: Mood normal.        Behavior: Behavior normal.        Thought Content: Thought content normal.        Judgment: Judgment normal.     Assessment And Plan:   Assessment & Plan Benign hypertension with CKD  (chronic kidney disease), stage II Blood pressure controlled at 126/80 mmHg. CKD stage 3A stable. Advised avoidance of NSAIDs and beta blockers. - Continue amlodipine  5 mg daily. - Avoid NSAIDs and beta blockers. - Annual nephrology follow-up for kidney function monitoring. Overweight with body mass index (BMI) of 29 to 29.9 in adult  Encounter for screening  Encounter for screening mammogram for breast cancer Pt instructed on Self Breast Exam.According to ACOG guidelines Women aged 81 and older are recommended to get an annual mammogram. Order placed for mammogram Acute cough  Persistent cough post-vaccination. Discussed thimerosal reaction and alternative vaccine options. - Prescribed prednisone  taper for cough. - Provided exemption letter from influenza vaccination. - Consider alternative influenza vaccine without thimerosal if needed.  Orders Placed This Encounter  Procedures   MM Digital Screening   BMP8+eGFR   Hepatitis B Surface Antibody    Return for keep same next.  Patient was given opportunity to ask questions. Patient verbalized understanding of the plan and was able to repeat key elements of the plan. All questions were answered to their satisfaction.    LILLETTE Rebecca Ada, FNP, have reviewed all documentation for this visit. The documentation on 12/27/23 for the exam, diagnosis, procedures, and orders are all accurate and complete.   IF YOU HAVE BEEN REFERRED TO A SPECIALIST, IT MAY TAKE 1-2 WEEKS TO SCHEDULE/PROCESS THE REFERRAL. IF YOU HAVE NOT HEARD FROM US /SPECIALIST IN TWO WEEKS, PLEASE GIVE US  A CALL AT [phone].

## 2023-12-27 NOTE — Assessment & Plan Note (Addendum)
 Blood pressure controlled at 126/80 mmHg. CKD stage 3A stable. Advised avoidance of NSAIDs and beta blockers. - Continue amlodipine  5 mg daily. - Avoid NSAIDs and beta blockers. - Annual nephrology follow-up for kidney function monitoring.

## 2023-12-27 NOTE — Progress Notes (Deleted)
 LILLETTE Kristeen JINNY Gladis, CMA,acting as a neurosurgeon for Gaines Ada, FNP.,have documented all relevant documentation on the behalf of Gaines Ada, FNP,as directed by  Gaines Ada, FNP while in the presence of Gaines Ada, FNP.  Subjective:  Patient ID: Rebecca Zamora , female    DOB: 09-15-1965 , 58 y.o.   MRN: [id number]  No chief complaint on file.    HPI  Discussed the use of AI scribe software for clinical note transcription with the patient, who gave verbal consent to proceed.  History of Present Illness   Past Medical History:  Diagnosis Date   Acute pelvic pain 01/23/2011   Atypical chest pain 06/24/2018   Cardiomegaly    HTN (hypertension)    Influenza 04/10/2023   No pertinent past medical history    Weakness 08/03/2022     Family History  Problem Relation Age of Onset   Multiple sclerosis Mother    Hypertension Father    Stroke Father    Gout Father    Multiple sclerosis Sister    Stroke Sister    Gout Brother    Hypertension Brother    Healthy Daughter    Healthy Son    Other Neg Hx    Diabetes Neg Hx      Current Outpatient Medications:    amLODipine  (NORVASC ) 5 MG tablet, Take 1 tablet (5 mg total) by mouth daily., Disp: 90 tablet, Rfl: 1   Blood Pressure Monitoring (OMRON 3 SERIES BP MONITOR) DEVI, Use as directed, Disp: 1 each, Rfl: 0   cyclobenzaprine  (FLEXERIL ) 10 MG tablet, Take 1 tablet (10 mg total) by mouth 3 (three) times daily as needed for muscle spasms., Disp: 30 tablet, Rfl: 2   EPINEPHrine  0.3 mg/0.3 mL IJ SOAJ injection, Inject 0.3 mg into the muscle as needed for anaphylaxis., Disp: 2 each, Rfl: 1   estradiol  (VIVELLE -DOT) 0.05 MG/24HR patch, Place 1 patch (0.05 mg total) onto the skin 2 (two) times a week., Disp: 24 patch, Rfl: 5   fluticasone  (FLONASE ) 50 MCG/ACT nasal spray, Place 1 spray into both nostrils daily., Disp: 16 g, Rfl: 2   linaclotide  (LINZESS ) 72 MCG capsule, Take 1 capsule (72 mcg total) by mouth daily before breakfast., Disp:  90 capsule, Rfl: 1   triamcinolone  ointment (KENALOG ) 0.1 %, Apply 1 application topically 2 (two) times daily as needed. Avoid the face., Disp: 454 g, Rfl: 1   VITAMIN D PO, Take 2,000 Units by mouth daily., Disp: , Rfl:    Allergies  Allergen Reactions   Nifedipine  Other (See Comments)    Depression, nausea, fatigue   Hydrocodone Nausea Only     Review of Systems   There were no vitals filed for this visit. There is no height or weight on file to calculate BMI.  Wt Readings from Last 3 Encounters:  06/25/23 153 lb (69.4 kg)  04/04/23 154 lb (69.9 kg)  12/25/22 154 lb 3.2 oz (69.9 kg)    The 10-year ASCVD risk score (Arnett DK, et al., 2019) is: 3.2%   Values used to calculate the score:     Age: 58 years     Clincally relevant sex: Female     Is Non-Hispanic African American: Yes     Diabetic: No     Tobacco smoker: No     Systolic Blood Pressure: 120 mmHg     Is BP treated: Yes     HDL Cholesterol: 78 mg/dL     Total Cholesterol: 187 mg/dL  Objective:  Physical Exam        No follow-ups on file.  Patient was given opportunity to ask questions. Patient verbalized understanding of the plan and was able to repeat key elements of the plan. All questions were answered to their satisfaction.    LILLETTE Gaines Ada, FNP, have reviewed all documentation for this visit. The documentation on 12/27/23 for the exam, diagnosis, procedures, and orders are all accurate and complete.   IF YOU HAVE BEEN REFERRED TO A SPECIALIST, IT MAY TAKE 1-2 WEEKS TO SCHEDULE/PROCESS THE REFERRAL. IF YOU HAVE NOT HEARD FROM US /SPECIALIST IN TWO WEEKS, PLEASE GIVE US  A CALL AT [phone].

## 2023-12-27 NOTE — Patient Instructions (Signed)
                         Contains text generated by [employer].                                 Contains text generated by [employer].

## 2023-12-28 LAB — BMP8+EGFR
BUN/Creatinine Ratio: 21 (ref 9–23)
BUN: 21 mg/dL (ref 6–24)
CO2: 23 mmol/L (ref 20–29)
Calcium: 9.7 mg/dL (ref 8.7–10.2)
Chloride: 102 mmol/L (ref 96–106)
Creatinine, Ser: 1 mg/dL (ref 0.57–1.00)
Glucose: 71 mg/dL (ref 70–99)
Potassium: 4 mmol/L (ref 3.5–5.2)
Sodium: 140 mmol/L (ref 134–144)
eGFR: 66 mL/min/1.73 (ref >59)

## 2023-12-28 LAB — HEPATITIS B SURFACE ANTIBODY,QUALITATIVE: Hep B Surface Ab, Qual: NONREACTIVE

## 2023-12-31 ENCOUNTER — Ambulatory Visit: Payer: Self-pay | Admitting: Nurse Practitioner

## 2024-01-23 ENCOUNTER — Other Ambulatory Visit (HOSPITAL_COMMUNITY): Payer: Self-pay

## 2024-02-15 ENCOUNTER — Other Ambulatory Visit (HOSPITAL_COMMUNITY): Payer: Self-pay

## 2024-02-18 ENCOUNTER — Other Ambulatory Visit: Payer: Self-pay | Admitting: Nurse Practitioner

## 2024-02-18 ENCOUNTER — Other Ambulatory Visit (HOSPITAL_COMMUNITY): Payer: Self-pay

## 2024-02-18 DIAGNOSIS — M791 Myalgia, unspecified site: Secondary | ICD-10-CM

## 2024-02-18 DIAGNOSIS — K5909 Other constipation: Secondary | ICD-10-CM

## 2024-02-20 ENCOUNTER — Other Ambulatory Visit (HOSPITAL_COMMUNITY): Payer: Self-pay

## 2024-02-20 MED ORDER — LINACLOTIDE 72 MCG PO CAPS
72.0000 ug | ORAL_CAPSULE | Freq: Every day | ORAL | 1 refills | Status: AC
  Filled 2024-02-20: qty 90, 90d supply, fill #0

## 2024-02-20 MED ORDER — CYCLOBENZAPRINE HCL 10 MG PO TABS
10.0000 mg | ORAL_TABLET | Freq: Three times a day (TID) | ORAL | 2 refills | Status: AC | PRN
  Filled 2024-02-20: qty 30, 10d supply, fill #0

## 2024-06-25 ENCOUNTER — Encounter: Payer: Self-pay | Admitting: Nurse Practitioner
